# Patient Record
Sex: Female | Born: 1990 | Race: White | Hispanic: No | Marital: Single | Smoking: Never smoker
Health system: Southern US, Community
[De-identification: ages and names within clinical notes are randomized; demographics above are authoritative.]

## PROBLEM LIST (undated history)

## (undated) DIAGNOSIS — F329 Major depressive disorder, single episode, unspecified: Secondary | ICD-10-CM

## (undated) DIAGNOSIS — IMO0002 Reserved for concepts with insufficient information to code with codable children: Secondary | ICD-10-CM

## (undated) DIAGNOSIS — A64 Unspecified sexually transmitted disease: Secondary | ICD-10-CM

## (undated) DIAGNOSIS — K589 Irritable bowel syndrome without diarrhea: Secondary | ICD-10-CM

## (undated) DIAGNOSIS — R87619 Unspecified abnormal cytological findings in specimens from cervix uteri: Secondary | ICD-10-CM

## (undated) DIAGNOSIS — F509 Eating disorder, unspecified: Secondary | ICD-10-CM

## (undated) DIAGNOSIS — F32A Depression, unspecified: Secondary | ICD-10-CM

## (undated) DIAGNOSIS — F419 Anxiety disorder, unspecified: Secondary | ICD-10-CM

## (undated) DIAGNOSIS — Z3493 Encounter for supervision of normal pregnancy, unspecified, third trimester: Secondary | ICD-10-CM

## (undated) DIAGNOSIS — A609 Anogenital herpesviral infection, unspecified: Secondary | ICD-10-CM

## (undated) DIAGNOSIS — R630 Anorexia: Secondary | ICD-10-CM

## (undated) HISTORY — DX: Major depressive disorder, single episode, unspecified: F32.9

## (undated) HISTORY — DX: Anorexia: R63.0

## (undated) HISTORY — DX: Unspecified sexually transmitted disease: A64

## (undated) HISTORY — DX: Unspecified abnormal cytological findings in specimens from cervix uteri: R87.619

## (undated) HISTORY — DX: Depression, unspecified: F32.A

## (undated) HISTORY — DX: Reserved for concepts with insufficient information to code with codable children: IMO0002

## (undated) HISTORY — DX: Eating disorder, unspecified: F50.9

## (undated) HISTORY — DX: Anxiety disorder, unspecified: F41.9

## (undated) HISTORY — PX: AUGMENTATION MAMMAPLASTY: SUR837

## (undated) HISTORY — DX: Irritable bowel syndrome, unspecified: K58.9

## (undated) HISTORY — DX: Anogenital herpesviral infection, unspecified: A60.9

## (undated) HISTORY — PX: COLPOSCOPY: SHX161

---
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
## 2008-03-06 ENCOUNTER — Encounter: Payer: Self-pay | Admitting: Internal Medicine

## 2008-05-25 ENCOUNTER — Telehealth: Payer: Self-pay | Admitting: Internal Medicine

## 2008-07-02 ENCOUNTER — Encounter: Payer: Self-pay | Admitting: Internal Medicine

## 2008-07-08 DIAGNOSIS — K589 Irritable bowel syndrome without diarrhea: Secondary | ICD-10-CM | POA: Insufficient documentation

## 2008-07-08 DIAGNOSIS — K313 Pylorospasm, not elsewhere classified: Secondary | ICD-10-CM | POA: Insufficient documentation

## 2008-07-08 DIAGNOSIS — F339 Major depressive disorder, recurrent, unspecified: Secondary | ICD-10-CM | POA: Insufficient documentation

## 2008-07-08 DIAGNOSIS — F329 Major depressive disorder, single episode, unspecified: Secondary | ICD-10-CM

## 2008-07-14 ENCOUNTER — Ambulatory Visit: Payer: Self-pay | Admitting: Internal Medicine

## 2009-03-05 ENCOUNTER — Encounter: Admission: RE | Admit: 2009-03-05 | Discharge: 2009-03-05 | Payer: Self-pay | Admitting: Obstetrics and Gynecology

## 2009-08-20 ENCOUNTER — Telehealth: Payer: Self-pay | Admitting: Internal Medicine

## 2010-02-03 NOTE — Progress Notes (Signed)
Summary: Medication  Medications Added PAROXETINE HCL 20 MG TABS (PAROXETINE HCL) Take 1 tablet by mouth once a day. MUST HAVE OFFICE VISIT FOR ANY FURTHER REFILLS! PAROXETINE HCL 20 MG TABS (PAROXETINE HCL) Take 1 tablet by mouth once a day. MUST HAVE OFFICE VISIT FOR FURTHER REFILLS! PAROXETINE HCL 20 MG TABS (PAROXETINE HCL) Take 1 tablet by mouth once a day. DOXYCYCLINE HYCLATE 50 MG CAPS (DOXYCYCLINE HYCLATE) one capsule by mouth once daily LEVSIN/SL 0.125 MG SUBL (HYOSCYAMINE SULFATE) Dissolve 1 tablet under tongue every 4 hours as needed for IBS       Phone Note Call from Patient Call back at Home Phone [phone]   Caller: Patient Call For: Dr. Juanda Chance Reason for Call: Talk to Nurse Summary of Call: Needs a refill on her Paxil.[facility] Initial call taken by: Karna Christmas,  August 20, 2009 11:33 AM  Follow-up for Phone Call        Prescription sent. Patient needs office visit for further refills. Follow-up by: Lamona Curl CMA Duncan Dull),  August 20, 2009 11:37 AM    New/Updated Medications: PAROXETINE HCL 20 MG TABS (PAROXETINE HCL) Take 1 tablet by mouth once a day. MUST HAVE OFFICE VISIT FOR FURTHER REFILLS! Prescriptions: PAROXETINE HCL 20 MG TABS (PAROXETINE HCL) Take 1 tablet by mouth once a day. MUST HAVE OFFICE VISIT FOR FURTHER REFILLS!  #30 x 0   Entered by:   Lamona Curl CMA (AAMA)   Authorized by:   Hart Carwin MD   Signed by:   Lamona Curl CMA (AAMA) on 08/20/2009   Method used:   Electronically to        [facility] # [phone]* (retail)       [address]       [place], [place]  [address]       Ph: [phone]       Fax: [phone]   RxID:   [id number]

## 2010-03-16 HISTORY — PX: COLPOSCOPY W/ BIOPSY / CURETTAGE: SUR283

## 2010-05-17 NOTE — Assessment & Plan Note (Signed)
[facility]                         GASTROENTEROLOGY OFFICE NOTE   [name], FILL                           MRN:          [id number]  DATE:01/25/2007                            DOB:          Jul 03, 1990    Anna [name] is a 20 year old female whom we saw in the past for IBS/diarrhea.  She had a complete evaluation which included CT scan of the abdomen and  the pelvis and upper abdominal ultrasound.  She also had small bowel  follow through and upper GI series which showed pylorospasm.  Patient  responded to Paxil initially 10 mg a day, subsequently 20 mg a day.  She  is quite happy with the medicine and has not had any recurrence of  diarrhea, abdominal pain, or any irregularity of her bowels.  She would  like to continue on her Paxil.   PHYSICAL EXAMINATION:  Blood pressure 114/72, pulse 84, and weight 159  pounds.  The patient was not examined.   IMPRESSION:  A 20 year old white female with irritable bowel  syndrome/diarrhea, controlled on Paxil.   PLAN:  Refill through [facility] 20 mg dispense 90 one p.o. daily.  The  patient will return in 1 year.     Hedwig Morton. Juanda Chance, MD  Electronically Signed    DMB/MedQ  DD: 01/25/2007  DT: 01/25/2007  Job #: [id number]   cc:   Gretta Arab. Valentina Lucks, M.D.

## 2010-05-20 NOTE — H&P (Signed)
NAME:  Anna [name], Anna [name]                              ACCOUNT NO.:  [id number]   MEDICAL RECORD NO.:  [id number]                   PATIENT TYPE:  INP   LOCATION:  0105                                 FACILITY:  [facility]   PHYSICIAN:  Beverly Milch, MD                  DATE OF BIRTH:  11-Nov-1990   DATE OF ADMISSION:  05/28/2003  DATE OF DISCHARGE:                         PSYCHIATRIC ADMISSION ASSESSMENT   IDENTIFYING DATA:  This 20 year old female, ninth grade student at [employer], is admitted emergently voluntarily in transfer from [facility], where she was admitted May 24, 2003 at 05:00 from  emergency room assessment five hours earlier for inpatient stabilization of  suicide attempt and depression.  The patient and family are weary from what  they consider five difficult days of medical treatment for the patient's  overdose during which time they impatiently expected to leave treatment  several times.  The patient did complete Mucomyst detoxification treatment  and her liver function tests have just, on the day transfer, started to come  down.  The patient has therefore had the bare minimum amount of medical  treatment, though the patient and family seem to think that she received  overly exhaustive treatment.  The patient states that all of the staff were  nice to her and the patient pressures parents to get her out of the hospital  by being nice to her.  The patient overdosed partly in anger at parents for  grounding her after having an unsupervised party with alcohol and older high  school friends in parent's absence from home.  The patient called her best  friend, who was angry at the patient for having the party and not telling  her, when she was seeking help with what to do about her overdose.  The  patient feels guilty about the suffering of older sister and mother,  relative to the patient's overdose but still, at the same time, the patient  has limited  remorse for her suicide attempt while being depressed since  04/25/02 when grandfather died and another school friend died.  There  is the significant psychological legacy of mother's brother killing himself  by an overdose in his teens, when he was turned away from a psychiatric  hospital and mother is on Prozac.   HISTORY OF PRESENT ILLNESS:  The patient, herself, has seen a therapist once  on an outpatient basis and did not return.  Family and patient in some ways  imply that patient did not return for further treatment because she was  doing too well, though it appears she never had a full assessment.  It  appears more likely that the patient did not return for treatment because  she refused and that the patient will likely refuse current and subsequent  aftercare treatment by being angry with the family.  The patient  have  imposed grounding consequences for the patient after she had apparently an  unsupervised party at the parent's home when they were away with neighbors  acknowledging and, in fact, having to break up the party with the patient's  request for assistance as people would not leave.  The patient had older  high school peers to the party where there was drinking.  The patient feels  she did not drink as much as the others.  The patient did not invite her  best girlfriend to the party or let her know about the party.  The best  girlfriend then became angry at the patient and refused to talk to her and  the patient could tell that her best friend was hurt by the incident just by  the sound of her voice.  The patient tends to hold all of her feelings  inside and just act in anger and demanding this as the youngest child of the  family.  The patient does not disclose to parents her disruptive behaviors  and parents tend to be silenced by the patient's anger over having to take  care of these things.  In fact, the mother states the patient must acquire  accountability for  taking care of her responsibilities and problems.  The  patient's grandfather died of cancer in April 17, 2002 and a friend was  killed in a car wreck from school.  The patient had significant difficulty  getting over these losses and went to counseling on one session.  Mother  takes Prozac but they will not clarify any other counseling or understanding  of the family psychological problems.  It is difficult to tell if mother was  more anxiously or depressively traumatized by the death of her brother as a  teenager but it appears more depressively traumatized.  The patient will  state that she feels guilty for the way she has hurt her mother and older  sister as older sister is due to graduate May 31, 2003 from high school and  the patient needs to be there.  The patient, therefore, has multiple ways,  with friends and family, she has narcissistically structured things that  injure others so that she, herself would not feel injured.  The patient's  best friend called parents who had to seek help for the patient's overdose  by indirect notification in the same that they learned about the party, that  neighbors acknowledged had taken place but the parents were not told  initially by neighbors but by the patient's sister.  The patient does not  acknowledge manic symptoms.  She does not acknowledge use of other drugs.  She does acknowledge using alcohol once or twice weekly, usually on the  weekends, including some binge-drinking at times.  She had overdosed May 23, 2003 at 22:30, apparently a day after being significantly intoxicated with  alcohol.   PAST MEDICAL HISTORY:  The patient has a history of acne, requiring p.r.n.  antibiotic treatment but none currently.  Last menses was May 18, 2003 and  she has been sexually active once without protection.  The patient is  otherwise in good general health.  She has had no seizure or syncope.  She has had no heart murmur or arrhythmia.  She has  had no organic central  nervous system trauma.  However, her protime was significantly elevated as  well as her AST and ALT values with AST reaching 190 and ALT 160 with  albumin and total  protein low.  The patient did retain the Mucomyst, though  finding it a stressful experience as well as having multiple venipunctures  and IVs.  Parents and patient report that they are exhausted.  From the  course of treatment, they expected to be apparently emergency room only but  then became extended over five days.   REVIEW OF SYSTEMS:  The patient denies difficulty with gait, gaze or  countenance.  She denies exposure to communicable disease or toxins.  She  denies rash, jaundice or purpura.  There is no chest pain, palpitations or  presyncope.  There is no abdominal pain, nausea, vomiting or diarrhea.  Both  arms are sore.  The patient has mild acne.  The patient is intelligent with  significant visual and auditory memory skills.   IMMUNIZATIONS:  Up to date.   FAMILY HISTORY:  Mother has depression, treated with Prozac.  Mother's  brother completed suicide by overdose in his teenage years, when he was  apparently turned away from a psychiatric hospital, at which they sought  admission for him.  The patient has an 60 year old sister, who is graduating  from high school in three days and resides with sister and both parents.   SOCIAL AND DEVELOPMENTAL HISTORY:  There are no complications or  consequences of gestation, delivery or neonatal period.  There have been no  developmental delays or learning disorders.  The patient's transition to  high school has been stressful with the patient hanging with older students  which is likely become alienating to her best girlfriend.  The patient has  been sexually active once without protection.  She has been using alcohol  with her peer group of older high school students including binge-drinking  once or twice weekly, usually on the weekend and  apparently being  significantly intoxicated the day before her overdose.  The patient is  resistant to parental direction including likely about her last  psychotherapy and now her hospitalization in the psychiatric unit.  Parents  maintain that the patient will not be able to tolerate group therapy process  work, while Dr. Lindie Spruce informed the parents that this would be the most  important aspect of treatment for becoming capable of family and subsequent  psychotherapeutic change.  Parents need to be able to define the grounding  consequences for the party as well as the expectations for aftercare  psychotherapy including family work before discharge in order for the  patient to establish an internal as well as an interpersonal commitment to  following through safely.   ASSETS:  The patient is intelligent and very capable of manipulation of  parents.   MENTAL STATUS EXAM:  Height is 68 inches and weight is 155 pounds, though she had a stated weight of 145 pounds.  Blood pressure is 131/92 with heart  rate of 96 (sitting) and 133/89 (standing).  Neurological exam is intact.  She is right-handed.  The patient is irritable and tired, indicating that it  was difficult to sleep in the hospital.  She has difficulty initiating sleep  at home as well.  The patient has a headache on arrival and is treated with  ibuprofen 600 mg.  The patient engages in milieu and group treatment  activities much better than she informed parents she would not be able to  do.  She has no anxiety.  She has atypical depressive features with  hypersensitivity to the comments or actions of others.  She has impulse  control difficulty.  She  appears to tend to overeat.  She has outbursts of  anger and her irritable devaluation of others becomes controlling.  She has  narcissistic personality traits but tends to subdue these in order to avoid  consequences from family and thereby she continues to escalate her  destructive  behaviors and risk-taking behaviors without logical and natural  consequences.  Mother seems to be the most vulnerable to the patient's  manipulation.  Father is much more able to set limits but not facilitate  mother's working through.  Family appears to have significant unidentified  conflicts in relational dynamics that patient seems to use to undermine any  change in behavior or emotions herself.  Legacy of uncle's suicide by  overdose, the treatment being sought was inadequate, is a Chief Technology Officer for the  patient and family without their awareness.   IMPRESSION:   AXIS I:  1. Depressive disorder not otherwise specified with atypical features.  2. Rule out alcohol abuse (provisional diagnosis).  3. 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  4. Parent-child problem.  5. Other specified family circumstances.  6. Other interpersonal problem.   AXIS II:  Diagnosis deferred.   AXIS III:  1. Acetaminophen overdose with hepatotoxicity.  2. Acne vulgaris.   AXIS IV:  Stressors:  Family--moderate to severe, acute and chronic; peer  relations--moderate, acute and chronic; phase of life--severe, acute.   AXIS V:  Admission Global Assessment of Functioning 43; highest in last year  estimated 78.   PLAN:  The patient is admitted for inpatient adolescent psychiatric and  multidisciplinary, multimodal behavioral health treatment in a team-based  program at locked psychiatric unit in which I would agree the milieu and  group therapies are most important for this patient's stabilization.  Family  therapy intervention is next most important as cognitive behavioral, milieu  and group therapy mobilize an understanding of the patient's dynamics and  behavioral and cognitive emotional changes necessary.  The patient is  involved in activities for which he is not emotionally prepared.  Suicide  risk intervention and substance abuse prevention are essential.  Anger   management is also important.  ESTIMATED LENGTH OF STAY:  Two to five days.                                               Beverly Milch, MD    GJ/MEDQ  D:  05/28/2003  T:  05/29/2003  Job:  [id number]

## 2010-05-20 NOTE — Op Note (Signed)
Anna [name], SHOAFF NO.:  [id number]   MEDICAL RECORD NO.:  [id number]          PATIENT TYPE:  OIB   LOCATION:  2899                         FACILITY:  [facility]   PHYSICIAN:  Jon Gills, M.D.  DATE OF BIRTH:  29-Apr-1990   DATE OF PROCEDURE:  04/08/2004  DATE OF DISCHARGE:  04/08/2004                                 OPERATIVE REPORT   PREOPERATIVE DIAGNOSIS:  Abdominal pain and diarrhea.   POSTOPERATIVE DIAGNOSIS:  Abdominal pain and diarrhea.   OPERATION PERFORMED:  Upper GI endoscopy with biopsy.   SURGEON:  Jon Gills, M.D.   ANESTHESIA:  General.   DESCRIPTION OF FINDINGS:  Following informed written consent, the patient  was taken to the operating room and placed under general anesthesia with  continuous cardiopulmonary monitoring.  She remained in the supine position  and the Olympus endoscope was advanced by mouth without difficulty.  There  was no visual evidence for esophagitis, gastritis, duodenitis or peptic  ulcer disease.  Significant retained food was found in her stomach and  duodenum, however.  A solitary gastric biopsy was negative for Helicobacter.  Several duodenal biopsies were histologically normal.  The endoscope was  gradually withdrawn and the patient was awakened and taken to recovery room  in satisfactory condition.  She will be released to the care of her parents  later today.   DESCRIPTION OF TECHNICAL PROCEDURES USED:  Olympus GIF-160 endoscope with  cold biopsy forceps.   DESCRIPTION OF SPECIMENS REMOVED:  Gastric times one, duodenum times three.      JHC/MEDQ  D:  04/18/2004  T:  04/18/2004  Job:  [id number]   cc:   Gretta Arab. Valentina Lucks, M.D.  [address] [place]  [place] [address]  Fax: [phone]

## 2010-05-20 NOTE — Assessment & Plan Note (Signed)
[facility]                           GASTROENTEROLOGY OFFICE NOTE   BREA, COLESON                           MRN:          [id number]  DATE:08/22/2005                            DOB:          [birth year]    Anna [name] is a 20 year old rising senior at [employer].  She  was at [employer] for two years and transferred to [employer]  because of flexible program to accommodate her diarrhea.  She has been, for  the past two years, having abdominal pain which is periumbilical, usually  located in anterior upper abdomen, occurring in the morning, resulting in  urgent bowel movements, sometimes diarrhea, but no bleeding.  She has missed  too much school in the mornings that she actually started flexible program  at the [employer] where her classes start at 12:00.  She sometimes  also has problems at night.  She works 4 days a week at [employer] from  5-9 and eats usually at 10:00.  She never has to get up at night to have a  bowel movement.  Her upper abdominal pain is usually relieved by multiple  bowel movements.  Her eating habits are quite erratic, especially since the  diarrhea started.  She basically skips meals to avoid going to the bathroom.  She saw Dr. Chestine Spore and I have the reports of his evaluation, as well as  normal upper abdominal ultrasound, upper GI series without small bowel  follow through, normal upper endoscopy and small bowel biopsies.  I do not  have any results of the blood tests.  She was tried on Bentyl before meals  without any relief, and then saw Dr. Loleta Chance, pediatric gastroenterologist, on  two occasions, since she was temporarily put on PPIs without any  improvement.  [name] has been quite anxious.  She has stopped basically eating  and has lost about 15 pounds in the last year because she is afraid that she  will have diarrhea.  There is positive family history of colon cancer in her  grandfather, positive family history of gallbladder disease in maternal  aunt.  Her mother actually has irritable bowel syndrome, having some  urgencies in the morning.   MEDICATIONS:  Birth control pills to control crampy abdominal pain and  doxycycline for acne.  Past history is significant for depression, anxiety.  Family history positive for prostate cancer in grandfather.   SOCIAL HISTORY:  She lives with her parents.  She is a Consulting civil engineer.  Does not  smoke, does not drink alcohol.   REVIEW OF SYSTEMS:  Negative except for new anxiety.   PHYSICAL EXAMINATION:  VITAL SIGNS:  Blood pressure 108/70.  Pulse 60.  Weight 143 pounds.  She was 156 pounds last year in Dr. Ophelia Charter office.  GENERAL:  She was oriented and in no acute distress.  Cooperative.  HEENT:  Sclerae were anicteric.  Oral cavity was normal.  No aphthous  ulcers.  NECK:  Supple, no adenopathy.  LUNGS:  Clear to auscultation.  No wheezes or rales.  CARDIAC:  Quiet precordial  murmurs. Normal S1, normal S2.  ABDOMEN:  Soft, nondistended, normoactive bowel sounds.  No abnormal rashes.  Tenderness in the epigastrium, periumbilical area and along the right costal  margin.  No rebound, no fullness.  Liver at tip of costal margin.  No CVA  tenderness bilaterally.  RECTAL:  No stool.  Rectal tone was normal.  Mucus was heme-negative.  No  perianal disease.  EXTREMITIES:  No edema.  Deep tendon reflexes were somewhat hyperactive.   IMPRESSION:  1. This is a 20 year old with abdominal pain.  Previously already worked      up by two gastroenterologists and labeled as irritable bowel syndrome.      I agree with the tentative diagnosis of irritable bowel syndrome as      well as pylorospasm, most likely on the basis of IBS.  There is a      remote possibility of endometriosis causing her crampy abdominal pain,      especially with periods, and aggravating her IBS.  This needs to be      further evaluated and she is going to see  Marva Panda for that next      week.  2. Diarrhea.  May be related to sensitive malabsorption, although her      small bowel biopsies were negative, but we are going to obtain tissue      transglutaminase.  As well, I doubt that we are dealing with      inflammatory bowel disease, and this is likely Crohn's disease, but      because of the weight loss, this may have to be assessed as well.  3. Positive family history of gallbladder disease in the setting of normal      ultrasound and possible family history of gallbladder disease, she may      end up with HIDA scan, but we are going to wait at this point.  4. Epigastrium and right upper quadrant discomfort.  Again, this could be      biliary, gastric, or just a colon spasm.   PLAN:  1. I would like to obtain blood test CMET to assess for serum albumin and      liver function tests.  Obtain sed rate, TSH levels and tissue      transglutaminase.  We are going to hold off on small bowel follow      through as well as on a colonoscopy at this time.  2. Start Donnatal Extend Tabs 1 at bedtime.  3. Start Paxil for anxiety 10 mg at bedtime.  4. I will see her again in six weeks.  5. She will also have CT scan of the abdomen and pelvis to look for      endometriosis, pelvic mass, lymphadenopathy, or any structural      abnormalities of the abdomen.                                   Hedwig Morton. Juanda Chance, MD   DMB/MedQ  DD:  08/22/2005  DT:  08/22/2005  Job #:  [id number]   cc:   Gretta Arab. Valentina Lucks, MD  [facility]

## 2010-05-20 NOTE — Discharge Summary (Signed)
NAME:  Anna [name], Anna [name]                              ACCOUNT NO.:  [id number]   MEDICAL RECORD NO.:  [id number]                   PATIENT TYPE:  INP   LOCATION:  0105                                 FACILITY:  [facility]   PHYSICIAN:  Beverly Milch, MD                  DATE OF BIRTH:  10/12/1990   DATE OF ADMISSION:  05/28/2003  DATE OF DISCHARGE:                                 DISCHARGE SUMMARY   IDENTIFYING DATA:  This 20 year old female ninth grade student at [employer] was admitted emergently voluntarily in transfer from [facility] after Mucomyst and intravenous support detoxification of  acetaminophen overdose, which had complex dynamics relative to sister's  graduation, best girlfriend disengaging from the patient's defiant use of  alcohol with older high school student to the exclusion of the best friend,  and mother intervening with grounding for having the party and messing up  sister's graduation.  The patient reacted with angry suicide attempt for  which she feels remorse for her sister and for mother, particularly as  mother experienced a suicide death of her teenage brother in the past when  he was declined psychiatric hospitalization.  Mother is now refusing to  allow the patient psychiatric hospitalization.  Mother and the patient both  resist psychotherapy as evidenced by disengaging from a single session of  psychotherapy in the past for the patient regarding depression over  grandfather's death in 05/06/04and the death of a school peer in a car  wreck.  Mother is on Prozac but the patient is resisting Prozac.  For full  details, please see the typed admission assessment.   HISTORY OF PRESENT ILLNESS:  The patient maintains that her depression is  more related to family and school affairs and builds up as these seem to  break down.  The patient acknowledges that father works a lot and plays golf  though he does spend a lot of time with mother.  The  patient can gradually  state that mother is too emotionally sensitive and that the patient becomes  alienated in her behavior and in any structured problem-solving by mother's  reactivity.  The patient has used alcohol in a binge fashion recently.  She  has been relatively defiant.  The patient is not as productive in school.   INITIAL MENTAL STATUS EXAM:  The patient has narcissistic, irritable  defiance that undermines mother's efforts to structure containment.  Mother  then becomes even more upset and undermines the patient's treatment  opportunity.  This was evident in the lengthy admission process in which  parents demanded for the patient to leave multiple times and did not want  her to work on solutions to these problems in any way that the patient did  not appreciate while at the same time indicating that the patient's attitude  and approach had to  change.  The patient had no psychotic symptoms.  She has  atypical depressive symptoms and her oppositionality is more adherent to her  identity disorder with narcissistic features though there is some concern  for consequences to her behavior and interpersonal function from episodic  alcohol use.   HOSPITAL COURSE AND TREATMENT:  No additional laboratory testing was  undertaken as the patient complained that both arms were very sore from IVs  and recurrent blood draws as she was being monitored for the course of her  Mucomyst detoxification of her acetaminophen overdose.  Mother minimizes the  significance of the patient's suicide attempt and just considers the patient  to have been angry.  The patient did explore in the course of hospital  treatment all aspects of her overdose in her bedroom in anger after which  she called her best girlfriend who got help for her, effectively in the end  restructuring nurturing relations from girlfriend and parents without  focusing as much on the patient's social and behavioral failure and having   the alcohol-related party for older high school students, which required the  neighbor's assistance to get the high school students out of the home before  parents got home.  The patient's general medical exam noted numerous  fractures from rollerblading and trampolining in the past.  The patient had  menarche at age 11 and acknowledged sexual activity once in the past but  that parents are not aware of this.  Parents are also not aware of the  extent of the patient's alcohol use in a binge type fashion that has been  short term but complicating the course of her high school development.  The  patient is emotionally not able to integrate the things she is doing in  competing with older sister and mother.  The patient could clarify in the  course of her group and milieu and individual work in therapy that she  desired a female therapist who is the opposite of her mother.  The patient did  make a commitment that she would attend therapy at least individually and  mother is making some commitment to attend family therapy with the patient.  The patient refused Prozac or other antidepressant pharmacotherapy though  such was explored thoroughly with the patient, particularly as possibly the  only option if she does not participate effectively in psychotherapy.  The  patient worked through her suicidal ideation though she never fully  clarified even for herself the dynamics of the actual moment of ingesting  the pills though she fully understands the dynamics leading up to the  ingestion and the events afterward.  She did improve in the course of  treatment though minimizing the need and significance doing so.  Her vital  signs remained normal and she resumed effective nutrition with restoration  of appetite and she had no suicidal ideation and had the capacity to abstain  from acting on any suicide impulses should such occur again.  Her blood pressure was 109/72 with heart rate 63 sitting at the  time of discharge and  standing blood pressure 111/69 with heart rate of 81.  She had no hepatic  dysfunction symptoms.  The patient did a better job in the final family  therapy session and could agree to comply with parents' conclusion that she  would be grounded for two weeks from the computer and grounded to home for  two weeks but would be allowed to use her cell phone after one week and  to  go to the swimming pool.  Parents concluded that one week in the hospital  served a significant portion of the necessary grounding as well.  The  patient expressed negative body language after that but could verbally  process her resolution.  Parents could maintain firm conviction to the  behavioral resolution and consequences and the patient did not have any  suicidal ideation emerge in the course of that final family therapy session.   FINAL DIAGNOSES:   AXIS I:  1. 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  2. Identity disorder with narcissistic features.  3. Rule out evolving alcohol abuse (provisional diagnosis).  4. Parent-child problem.  5. Other specified family circumstances.  6. Other interpersonal problems.   AXIS II:  Diagnosis deferred.   AXIS III:  1. Acetaminophen overdose with hepatotoxicity.  2. Acne vulgaris.   AXIS IV:  Stressors: Family- moderate to severe, acute and chronic; peer  relations- moderate, acute and chronic; phase of life- severe, acute.   AXIS V:  Global assessment of functioning at the time of admission was 43  with highest in the last year estimated at 78 and discharge global  assessment of functioning 49.   PLAN:  The patient was discharged in improved condition though needing three  to four more days of inpatient treatment that parents disavow and disallow.  Parents conclude that they are convinced the patient is absolutely not  suicidal and that they can provide containment and support for resolution of  current level  of intense family and intrapsychic conflict for resolution.  They do agree to ongoing psychotherapy, recognizing the benefit of  psychotherapeutic interventions thus far though they have been unable to  tolerate these in the past.  The patient is on no medications at the time of  discharge.  Still, she understands that Prozac or an alternative  antidepressant will be necessary, particularly if she fails in psychotherapy  or particularly if she again undermines psychotherapy by not attending.  She  follows a regular diet and has no restrictions on physical activity.  Crisis  and safety plans are outlined if needed.  They will see Glendell Docker for  individual and family therapy with parents to call for the appointment and  will see Jasmine Pang, M.D., for psychiatric followup.  Parents also  require to call but knowing that appointments are available for the week of June 9.  There is a signed release on the chart for both courtesy copies.                                               Beverly Milch, MD    GJ/MEDQ  D:  05/30/2003  T:  05/30/2003  Job:  [id number]   cc:   Glendell Docker, MS, CCAS, MAC, NCC,  [address] [place]  [address]  [place], [place] [address]   Jasmine Pang, M.D.  Fax: [phone]

## 2010-05-20 NOTE — Assessment & Plan Note (Signed)
[facility]                           GASTROENTEROLOGY OFFICE NOTE   ARANDA, BIHM                           MRN:          [id number]  DATE:11/27/2005                            DOB:          [birth year]    Anna [name] is a very nice 20 year old high school student whom we saw in August of  this year for abdominal pain and diarrhea.  Her GI evaluation was  essentially negative.  We have put her on Paxil 10 mg a day and she has had  about 50% improvement of her symptoms.  Her mother, today, reports that this  is the best that Anna [name] has been in a long time.  She essentially denies  diarrhea.  She has occasional abdominal pain about twice a week, but has not  missed any school.  She takes her medication at night.   PHYSICAL EXAM:  Blood pressure 104/68, pulse 60, and weight 147 pounds.  She was alert and oriented, no distress.  Her abdominal exam was remarkable for tenderness in the epigastric area.  Lower abdomen was normal.  Bowel sounds were normoactive.  There was no  distension.  Left upper quadrant was normal.   IMPRESSION:  A 20 year old white female with irritable bowel syndrome,  functional epigastric pain, improved on Paxil 10 mg a day.   PLAN:  In order to reduce the frequency of the abdominal discomfort, we will  try to increase the Paxil to 20 mg per day, which is the usual dose anyway.  She will take it on a daily basis for the next 4 weeks and see whether the  increased dose improves the GI complaints.  If not, she will go back down to  10 mg daily.  I will see her in about 6 months.     Hedwig Morton. Juanda Chance, MD  Electronically Signed    DMB/MedQ  DD: 11/27/2005  DT: 11/27/2005  Job #: [id number]   cc:   Gretta Arab. Valentina Lucks, M.D.

## 2010-05-20 NOTE — Discharge Summary (Signed)
NAME:  Anna [name], Anna [name]                              ACCOUNT NO.:  [id number]   MEDICAL RECORD NO.:  [id number]                   PATIENT TYPE:  INP   LOCATION:  6126                                 FACILITY:  [facility]   PHYSICIAN:  Asher Muir, M.D.                      DATE OF BIRTH:  [birth year]   DATE OF ADMISSION:  05/23/2003  DATE OF DISCHARGE:  05/28/2003                                 DISCHARGE SUMMARY   DISCHARGE DIAGNOSES:  1. Elevated transaminase levels.  2. Intentional ingestion of Tylenol.  3. Status post 17 doses of Mucomyst.   CONSULTS:  None.   PROCEDURES:  None.   LABORATORY DATA:  Initial acetaminophen level was 59.7.  Acetaminophen level  four hours later was 78.7.  Acetaminophen level 24 hours after initial draw  was less than 10.  Alcohol level was 5 on admission.  Urine pregnancy test  was negative.  Urine tox screen was negative.  Initial liver function  enzymes revealed an AST of 168 with an ALT OF 100.  Follow-up enzymes for  the next 72 hours continued to increase with a maximum AST of 190 and an AL  OF 160.  On the day of discharge, the patient's AST was 183 and the  patient's ALT was 160.  Thus, her liver function tests have now peaked.   Remainder of the hepatic panel including bilirubin and alkaline phosphatase  is negative.  Initial INR was 1.4.  INR on discharge was 1.2.  CBC was  within normal limits and BNP was within normal limits.  Hepatitis serologies  are pending at time of discharge.   HISTORY OF PRESENT ILLNESS:  Anna [name] is a 20 year old Caucasian female with no  significant past medical history who admits to intentional ingestion of 10  to 12 Tylenol Extended Release 650 mg tablets at 10:30 p.m. on May 21.  The  patient immediately contacted a friend who then contacted her father who  then contacted the patient's parents to notify them of the ingestion who  then brought her to the emergency room.  The patient denies that she was  intentionally trying  to hurt herself; however, she had just had an argument  with her parents regarding disciplinary actions.   The patient's physical exam and vital signs were normal.  Her only physical  symptom at the time of admission was some right upper quadrant pain and  nausea.  Please see the admission H&P for full admission details.   HOSPITAL COURSE:  Intentional Tylenol ingestion.  The laboratory data is  listed above.  The patient was given Mucomyst every 4 hours for a total of  19 doses during the close monitoring of her liver function tests.  The [facility] toxicologist was consulted regarding further  continuation of the Mucomyst given that enzymes had not peaked after 72  hours.  The toxicologist recommended to discontinue the Mucomyst.  In the 24  hours following discontinuation of the Mucomyst, the LFTs have peaked and  are now decreasing.  Thus, there is no further need for intervention at this  time.  The patient's nausea resolved within a couple days following  admission, and she was at her normal baseline activity level and was  tolerating p.o. without complication.  At no time did the patient have  jaundice or other physical symptoms of liver failure.  The patient denied  active suicidal ideation or a plan throughout the remainder of hospital  course; however, a sitter was present during the patient's entire hospital  course.   DISCHARGE DESTINATION:  The patient is to be discharged to [facility] for further psychological evaluation.   SUGGESTIVE FOLLOW-UP ITEMS:  1. The patient should have appropriate STD screening which would probably     include urine studies for GC and Chlamydia as well as HIV and syphilis     testing.  2. The patient's hepatitis serologies are pending at time of discharge.     These items should be followed up throughout the remainder of her     inpatient course.   FOLLOW-UP APPOINTMENTS:  1. Follow-up psychology  appointments will be arranged by [facility].  2. The patient will follow up with her primary care Anna [name] as needed.      Franchot Mimes, MD                         Asher Muir, M.D.    TV/MEDQ  D:  05/28/2003  T:  05/30/2003  Job:  [id number]   cc:   Anna [name], M.D.  [address] [place]  [place] [address]  Fax: [phone]   [address] [place]. [phone]

## 2010-05-25 ENCOUNTER — Other Ambulatory Visit: Payer: Self-pay | Admitting: Dermatology

## 2010-08-03 ENCOUNTER — Emergency Department (HOSPITAL_COMMUNITY)
Admission: EM | Admit: 2010-08-03 | Discharge: 2010-08-03 | Disposition: A | Discharge status: Home / Self Care | Payer: BC Managed Care – PPO | Attending: Emergency Medicine | Admitting: Emergency Medicine

## 2010-08-03 ENCOUNTER — Emergency Department (HOSPITAL_COMMUNITY): Payer: BC Managed Care – PPO

## 2010-08-03 DIAGNOSIS — R232 Flushing: Secondary | ICD-10-CM | POA: Insufficient documentation

## 2010-08-03 DIAGNOSIS — R5381 Other malaise: Secondary | ICD-10-CM | POA: Insufficient documentation

## 2010-08-03 DIAGNOSIS — R55 Syncope and collapse: Secondary | ICD-10-CM | POA: Insufficient documentation

## 2010-08-03 DIAGNOSIS — R42 Dizziness and giddiness: Secondary | ICD-10-CM | POA: Insufficient documentation

## 2010-08-03 DIAGNOSIS — R61 Generalized hyperhidrosis: Secondary | ICD-10-CM | POA: Insufficient documentation

## 2010-08-03 DIAGNOSIS — R5383 Other fatigue: Secondary | ICD-10-CM | POA: Insufficient documentation

## 2010-08-03 LAB — BASIC METABOLIC PANEL
BUN: 15 mg/dL (ref 6–23)
CO2: 24 mEq/L (ref 19–32)
Chloride: 106 mEq/L (ref 96–112)
Creatinine, Ser: 1.06 mg/dL (ref 0.50–1.10)
Potassium: 4.4 mEq/L (ref 3.5–5.1)

## 2010-08-03 LAB — CBC
HCT: 38.1 % (ref 36.0–46.0)
MCV: 95.3 fL (ref 78.0–100.0)
Platelets: 204 10*3/uL (ref 150–400)
RBC: 4 MIL/uL (ref 3.87–5.11)
WBC: 5.2 10*3/uL (ref 4.0–10.5)

## 2010-08-03 LAB — DIFFERENTIAL
Basophils Absolute: 0 10*3/uL (ref 0.0–0.1)
Lymphocytes Relative: 21 % (ref 12–46)
Lymphs Abs: 1.1 10*3/uL (ref 0.7–4.0)
Neutrophils Relative %: 71 % (ref 43–77)

## 2010-08-03 LAB — TROPONIN I: Troponin I: <0.3 ng/mL (ref <0.30)

## 2011-03-03 DIAGNOSIS — R630 Anorexia: Secondary | ICD-10-CM

## 2011-03-03 HISTORY — DX: Anorexia: R63.0

## 2011-03-27 ENCOUNTER — Other Ambulatory Visit: Payer: Self-pay | Admitting: Nurse Practitioner

## 2011-03-27 ENCOUNTER — Other Ambulatory Visit: Payer: Self-pay | Admitting: Obstetrics and Gynecology

## 2011-03-27 DIAGNOSIS — R630 Anorexia: Secondary | ICD-10-CM

## 2012-03-26 ENCOUNTER — Encounter: Payer: Self-pay | Admitting: Nurse Practitioner

## 2012-03-26 DIAGNOSIS — F5000 Anorexia nervosa, unspecified: Secondary | ICD-10-CM | POA: Insufficient documentation

## 2012-03-28 ENCOUNTER — Ambulatory Visit: Payer: Self-pay | Admitting: Nurse Practitioner

## 2012-05-16 ENCOUNTER — Ambulatory Visit: Payer: Self-pay | Admitting: Nurse Practitioner

## 2012-06-12 ENCOUNTER — Ambulatory Visit (INDEPENDENT_AMBULATORY_CARE_PROVIDER_SITE_OTHER): Payer: BC Managed Care – PPO | Admitting: Nurse Practitioner

## 2012-06-12 ENCOUNTER — Encounter: Payer: Self-pay | Admitting: Nurse Practitioner

## 2012-06-12 VITALS — BP 104/60 | HR 74 | Resp 12 | Ht 68.0 in | Wt 149.8 lb

## 2012-06-12 DIAGNOSIS — Z113 Encounter for screening for infections with a predominantly sexual mode of transmission: Secondary | ICD-10-CM

## 2012-06-12 DIAGNOSIS — Z0000 Encounter for general adult medical examination without abnormal findings: Secondary | ICD-10-CM

## 2012-06-12 DIAGNOSIS — Z01419 Encounter for gynecological examination (general) (routine) without abnormal findings: Secondary | ICD-10-CM

## 2012-06-12 LAB — POCT URINALYSIS DIPSTICK
Spec Grav, UA: 1.015
Urobilinogen, UA: NEGATIVE
pH, UA: 6

## 2012-06-12 LAB — STD PANEL
HIV: NONREACTIVE
Hepatitis B Surface Ag: NEGATIVE

## 2012-06-12 MED ORDER — LEVONORGESTREL-ETHINYL ESTRAD 0.15-30 MG-MCG PO TABS: 1.0000 | ORAL_TABLET | Freq: Every day | ORAL | Status: DC

## 2012-06-12 NOTE — Progress Notes (Signed)
22 y.o. G0P0000 Single Caucasian Fe here for annual exam.  Menses last for 5 days, moderate to light. Slight cramps. Same partner for 7 months. Wants STD's testing.  She still struggles with weight issues and does not feel that Paxil is helping her any.  She continues in therapy.  Patient's last menstrual period was 05/31/2012.          Sexually active: yes  The current method of family planning is OCP (estrogen/progesterone).    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    reports that she has never smoked. She has never used smokeless tobacco. She reports that she does not drink alcohol or use illicit drugs.  Past Medical History  Diagnosis Date  . Anxiety   . Depression   . IBS (irritable bowel syndrome)   . 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 Anorexia 03/2011    Treated at [facility] X 2 mos  . Eating disorder     binges and purges daily     Past Surgical History  Procedure Laterality Date  . Colposcopy w/ biopsy / curettage  03/16/10    mild dysplasia HPV effect, CIN I    Current Outpatient Prescriptions  Medication Sig Dispense Refill  . levonorgestrel-ethinyl estradiol (PORTIA-28) 0.15-30 MG-MCG tablet Take 1 tablet by mouth daily.      Marland Kitchen PARoxetine (PAXIL) 40 MG tablet Take 40 mg by mouth every morning.       No current facility-administered medications for this visit.    Family History  Problem Relation Age of Onset  . Heart disease Father   . Prostate cancer Maternal Grandfather   . Lung cancer Paternal Grandfather     ROS:  Pertinent items are noted in HPI.  Otherwise, a comprehensive ROS was negative.  Exam:   BP 104/60  Pulse 74  Resp 12  Ht 5\' 8"  (1.727 m)  Wt 149 lb 12.8 oz (67.949 kg)  BMI 22.78 kg/m2  LMP 05/31/2012 Height: 5\' 8"   (172.7 cm)                Weight 03/27/10 = 114 lbs  Ht Readings from Last 3 Encounters:  06/12/12 5\' 8"  (1.727 m)  07/14/08 5\' 8"  (1.727 m)    General appearance: alert, cooperative and appears stated age Head: Normocephalic, without obvious abnormality, atraumatic Neck: no adenopathy, supple, symmetrical, trachea midline and thyroid normal to inspection and palpation Lungs: clear to auscultation bilaterally Breasts: normal appearance, no masses or tenderness Heart: regular rate and rhythm Abdomen: soft, non-tender; no masses,  no organomegaly Extremities: extremities normal, atraumatic, no cyanosis or edema Skin: Skin color, texture, turgor normal. No rashes or lesions Lymph nodes: Cervical, supraclavicular, and axillary nodes normal. No abnormal inguinal nodes palpated Neurologic: Grossly normal   Pelvic: External genitalia:  no lesions              Urethra:  normal appearing urethra with no masses, tenderness or lesions              Bartholin's and Skene's: normal                 Vagina: normal appearing vagina with normal color and discharge, no lesions  Cervix: anteverted              Pap taken: yes Bimanual Exam:  Uterus:  normal size, contour, position, consistency, mobility, non-tender              Adnexa: no mass, fullness, tenderness               Rectovaginal: Confirms               Anus:  normal sphincter tone, no lesions  A:  Well Woman with normal exam  Contraception  History of anorexia and bulimia  R/O STD  P:   Pap smear as per guidelines   Refill on Portia OCP for 1 year  Call with results STD's  counseled on STD prevention, adequate intake of calcium and vitamin D,   diet and exercise  Encouraged to continue with counseling return annually or prn  An After Visit Summary was printed and given to the patient.

## 2012-06-12 NOTE — Progress Notes (Signed)
Reviewed personally.  M. Suzanne Evora Schechter, MD.  

## 2012-06-12 NOTE — Patient Instructions (Signed)
General topics  Next pap or exam is  due in 1 year Take a Women's multivitamin Take 1200 mg. of calcium daily - prefer dietary If any concerns in interim to call back  Breast Self-Awareness Practicing breast self-awareness may pick up problems early, prevent significant medical complications, and possibly save your life. By practicing breast self-awareness, you can become familiar with how your breasts look and feel and if your breasts are changing. This allows you to notice changes early. It can also offer you some reassurance that your breast health is good. One way to learn what is normal for your breasts and whether your breasts are changing is to do a breast self-exam. If you find a lump or something that was not present in the past, it is best to contact your caregiver right away. Other findings that should be evaluated by your caregiver include nipple discharge, especially if it is bloody; skin changes or reddening; areas where the skin seems to be pulled in (retracted); or new lumps and bumps. Breast pain is seldom associated with cancer (malignancy), but should also be evaluated by a caregiver. BREAST SELF-EXAM The best time to examine your breasts is 5 7 days after your menstrual period is over.  ExitCare Patient Information 2013 [employer].   Exercise to Stay Healthy Exercise helps you become and stay healthy. EXERCISE IDEAS AND TIPS Choose exercises that:  You enjoy.  Fit into your day. You do not need to exercise really hard to be healthy. You can do exercises at a slow or medium level and stay healthy. You can:  Stretch before and after working out.  Try yoga, Pilates, or tai chi.  Lift weights.  Walk fast, swim, jog, run, climb stairs, bicycle, dance, or rollerskate.  Take aerobic classes. Exercises that burn about 150 calories:  Running 1  miles in 15 minutes.  Playing volleyball for 45 to 60 minutes.  Washing and waxing a car for 45 to 60  minutes.  Playing touch football for 45 minutes.  Walking 1  miles in 35 minutes.  Pushing a stroller 1  miles in 30 minutes.  Playing basketball for 30 minutes.  Raking leaves for 30 minutes.  Bicycling 5 miles in 30 minutes.  Walking 2 miles in 30 minutes.  Dancing for 30 minutes.  Shoveling snow for 15 minutes.  Swimming laps for 20 minutes.  Walking up stairs for 15 minutes.  Bicycling 4 miles in 15 minutes.  Gardening for 30 to 45 minutes.  Jumping rope for 15 minutes.  Washing windows or floors for 45 to 60 minutes. Document Released: 01/21/2010 Document Revised: 03/13/2011 Document Reviewed: 01/21/2010 ExitCare Patient Information 2013 [employer].   Other topics ( that may be useful information):    Sexually Transmitted Disease Sexually transmitted disease (STD) refers to any infection that is passed from person to person during sexual activity. This may happen by way of saliva, semen, blood, vaginal mucus, or urine. Common STDs include:  Gonorrhea.  Chlamydia.  Syphilis.  HIV/AIDS.  Genital herpes.  Hepatitis B and C.  Trichomonas.  Human papillomavirus (HPV).  Pubic lice. CAUSES  An STD may be spread by bacteria, virus, or parasite. A person can get an STD by:  Sexual intercourse with an infected person.  Sharing sex toys with an infected person.  Sharing needles with an infected person.  Having intimate contact with the genitals, mouth, or rectal areas of an infected person. SYMPTOMS  Some people may not have any symptoms, but   they can still pass the infection to others. Different STDs have different symptoms. Symptoms include:  Painful or bloody urination.  Pain in the pelvis, abdomen, vagina, anus, throat, or eyes.  Skin rash, itching, irritation, growths, or sores (lesions). These usually occur in the genital or anal area.  Abnormal vaginal discharge.  Penile discharge in men.  Soft, flesh-colored skin growths in the  genital or anal area.  Fever.  Pain or bleeding during sexual intercourse.  Swollen glands in the groin area.  Yellow skin and eyes (jaundice). This is seen with hepatitis. DIAGNOSIS  To make a diagnosis, your caregiver may:  Take a medical history.  Perform a physical exam.  Take a specimen (culture) to be examined.  Examine a sample of discharge under a microscope.  Perform blood test TREATMENT   Chlamydia, gonorrhea, trichomonas, and syphilis can be cured with antibiotic medicine.  Genital herpes, hepatitis, and HIV can be treated, but not cured, with prescribed medicines. The medicines will lessen the symptoms.  Genital warts from HPV can be treated with medicine or by freezing, burning (electrocautery), or surgery. Warts may come back.  HPV is a virus and cannot be cured with medicine or surgery.However, abnormal areas may be followed very closely by your caregiver and may be removed from the cervix, vagina, or vulva through office procedures or surgery. If your diagnosis is confirmed, your recent sexual partners need treatment. This is true even if they are symptom-free or have a negative culture or evaluation. They should not have sex until their caregiver says it is okay. HOME CARE INSTRUCTIONS  All sexual partners should be informed, tested, and treated for all STDs.  Take your antibiotics as directed. Finish them even if you start to feel better.  Only take over-the-counter or prescription medicines for pain, discomfort, or fever as directed by your caregiver.  Rest.  Eat a balanced diet and drink enough fluids to keep your urine clear or pale yellow.  Do not have sex until treatment is completed and you have followed up with your caregiver. STDs should be checked after treatment.  Keep all follow-up appointments, Pap tests, and blood tests as directed by your caregiver.  Only use latex condoms and water-soluble lubricants during sexual activity. Do not use  petroleum jelly or oils.  Avoid alcohol and illegal drugs.  Get vaccinated for HPV and hepatitis. If you have not received these vaccines in the past, talk to your caregiver about whether one or both might be right for you.  Avoid risky sex practices that can break the skin. The only way to avoid getting an STD is to avoid all sexual activity.Latex condoms and dental dams (for oral sex) will help lessen the risk of getting an STD, but will not completely eliminate the risk. SEEK MEDICAL CARE IF:   You have a fever.  You have any new or worsening symptoms. Document Released: 03/11/2002 Document Revised: 03/13/2011 Document Reviewed: 03/18/2010 ExitCare Patient Information 2013 [employer].    Domestic Abuse You are being battered or abused if someone close to you hits, pushes, or physically hurts you in any way. You also are being abused if you are forced into activities. You are being sexually abused if you are forced to have sexual contact of any kind. You are being emotionally abused if you are made to feel worthless or if you are constantly threatened. It is important to remember that help is available. No one has the right to abuse you. PREVENTION OF FURTHER   ABUSE  Learn the warning signs of danger. This varies with situations but may include: the use of alcohol, threats, isolation from friends and family, or forced sexual contact. Leave if you feel that violence is going to occur.  If you are attacked or beaten, report it to the police so the abuse is documented. You do not have to press charges. The police can protect you while you or the attackers are leaving. Get the officer's name and badge number and a copy of the report.  Find someone you can trust and tell them what is happening to you: your caregiver, a nurse, clergy member, close friend or family member. Feeling ashamed is natural, but remember that you have done nothing wrong. No one deserves abuse. Document Released:  12/17/1999 Document Revised: 03/13/2011 Document Reviewed: 02/24/2010 ExitCare Patient Information 2013 [employer].    How Much is Too Much Alcohol? Drinking too much alcohol can cause injury, accidents, and health problems. These types of problems can include:   Car crashes.  Falls.  Family fighting (domestic violence).  Drowning.  Fights.  Injuries.  Burns.  Damage to certain organs.  Having a baby with birth defects. ONE DRINK CAN BE TOO MUCH WHEN YOU ARE:  Working.  Pregnant or breastfeeding.  Taking medicines. Ask your doctor.  Driving or planning to drive. If you or someone you know has a drinking problem, get help from a doctor.  Document Released: 10/15/2008 Document Revised: 03/13/2011 Document Reviewed: 10/15/2008 ExitCare Patient Information 2013 [employer].   Smoking Hazards Smoking cigarettes is extremely bad for your health. Tobacco smoke has over 200 known poisons in it. There are over 60 chemicals in tobacco smoke that cause cancer. Some of the chemicals found in cigarette smoke include:   Cyanide.  Benzene.  Formaldehyde.  Methanol (wood alcohol).  Acetylene (fuel used in welding torches).  Ammonia. Cigarette smoke also contains the poisonous gases nitrogen oxide and carbon monoxide.  Cigarette smokers have an increased risk of many serious medical problems and Smoking causes approximately:  90% of all lung cancer deaths in men.  80% of all lung cancer deaths in women.  90% of deaths from chronic obstructive lung disease. Compared with nonsmokers, smoking increases the risk of:  Coronary heart disease by 2 to 4 times.  Stroke by 2 to 4 times.  Men developing lung cancer by 23 times.  Women developing lung cancer by 13 times.  Dying from chronic obstructive lung diseases by 12 times.  . Smoking is the most preventable cause of death and disease in our society.  WHY IS SMOKING ADDICTIVE?  Nicotine is the chemical  agent in tobacco that is capable of causing addiction or dependence.  When you smoke and inhale, nicotine is absorbed rapidly into the bloodstream through your lungs. Nicotine absorbed through the lungs is capable of creating a powerful addiction. Both inhaled and non-inhaled nicotine may be addictive.  Addiction studies of cigarettes and spit tobacco show that addiction to nicotine occurs mainly during the teen years, when young people begin using tobacco products. WHAT ARE THE BENEFITS OF QUITTING?  There are many health benefits to quitting smoking.   Likelihood of developing cancer and heart disease decreases. Health improvements are seen almost immediately.  Blood pressure, pulse rate, and breathing patterns start returning to normal soon after quitting. QUITTING SMOKING   American Lung Association - 1-800-LUNGUSA  American Cancer Society - 1-800-ACS-2345 Document Released: 01/27/2004 Document Revised: 03/13/2011 Document Reviewed: 09/30/2008 ExitCare Patient Information 2013 [employer].   Stress Management Stress is a state of physical or mental tension that often results from changes in your life or normal routine. Some common causes of stress are:  Death of a loved one.  Injuries or severe illnesses.  Getting fired or changing jobs.  Moving into a new home. Other causes may be:  Sexual problems.  Business or financial losses.  Taking on a large debt.  Regular conflict with someone at home or at work.  Constant tiredness from lack of sleep. It is not just bad things that are stressful. It may be stressful to:  Win the lottery.  Get married.  Buy a new car. The amount of stress that can be easily tolerated varies from person to person. Changes generally cause stress, regardless of the types of change. Too much stress can affect your health. It may lead to physical or emotional problems. Too little stress (boredom) may also become stressful. SUGGESTIONS TO  REDUCE STRESS:  Talk things over with your family and friends. It often is helpful to share your concerns and worries. If you feel your problem is serious, you may want to get help from a professional counselor.  Consider your problems one at a time instead of lumping them all together. Trying to take care of everything at once may seem impossible. List all the things you need to do and then start with the most important one. Set a goal to accomplish 2 or 3 things each day. If you expect to do too many in a single day you will naturally fail, causing you to feel even more stressed.  Do not use alcohol or drugs to relieve stress. Although you may feel better for a short time, they do not remove the problems that caused the stress. They can also be habit forming.  Exercise regularly - at least 3 times per week. Physical exercise can help to relieve that "uptight" feeling and will relax you.  The shortest distance between despair and hope is often a good night's sleep.  Go to bed and get up on time allowing yourself time for appointments without being rushed.  Take a short "time-out" period from any stressful situation that occurs during the day. Close your eyes and take some deep breaths. Starting with the muscles in your face, tense them, hold it for a few seconds, then relax. Repeat this with the muscles in your neck, shoulders, hand, stomach, back and legs.  Take good care of yourself. Eat a balanced diet and get plenty of rest.  Schedule time for having fun. Take a break from your daily routine to relax. HOME CARE INSTRUCTIONS   Call if you feel overwhelmed by your problems and feel you can no longer manage them on your own.  Return immediately if you feel like hurting yourself or someone else. Document Released: 06/14/2000 Document Revised: 03/13/2011 Document Reviewed: 02/04/2007 ExitCare Patient Information 2013 [employer].   

## 2012-06-14 LAB — IPS N GONORRHOEA AND CHLAMYDIA BY PCR

## 2012-06-17 ENCOUNTER — Telehealth: Payer: Self-pay | Admitting: *Deleted

## 2012-06-17 NOTE — Telephone Encounter (Signed)
Message copied by Osie Bond on Mon Jun 17, 2012 10:30 AM ------      Message from: Ria Comment R      Created: Mon Jun 17, 2012  8:34 AM       Let patient know negative ------

## 2012-06-17 NOTE — Telephone Encounter (Signed)
LVM for pt to return my call in regards to lab results.  

## 2012-06-18 ENCOUNTER — Telehealth: Payer: Self-pay | Admitting: *Deleted

## 2012-06-18 NOTE — Telephone Encounter (Signed)
Pt is aware of all negative lab results.  

## 2012-06-18 NOTE — Telephone Encounter (Signed)
Message copied by Osie Bond on Tue Jun 18, 2012  9:23 AM ------      Message from: Ria Comment R      Created: Thu Jun 13, 2012 10:12 AM       Hold for rest of labs ------

## 2012-06-18 NOTE — Telephone Encounter (Signed)
LVM for pt to return my call in regards to lab results.  

## 2012-11-02 DIAGNOSIS — A64 Unspecified sexually transmitted disease: Secondary | ICD-10-CM

## 2012-11-02 HISTORY — DX: Unspecified sexually transmitted disease: A64

## 2012-11-25 ENCOUNTER — Telehealth: Payer: Self-pay | Admitting: Nurse Practitioner

## 2012-11-25 ENCOUNTER — Ambulatory Visit (INDEPENDENT_AMBULATORY_CARE_PROVIDER_SITE_OTHER): Payer: BC Managed Care – PPO | Admitting: Nurse Practitioner

## 2012-11-25 ENCOUNTER — Encounter: Payer: Self-pay | Admitting: Nurse Practitioner

## 2012-11-25 VITALS — BP 116/74 | HR 68 | Ht 68.0 in | Wt 150.0 lb

## 2012-11-25 DIAGNOSIS — N9089 Other specified noninflammatory disorders of vulva and perineum: Secondary | ICD-10-CM

## 2012-11-25 MED ORDER — LORAZEPAM 0.5 MG PO TABS: 0.5000 mg | ORAL_TABLET | Freq: Three times a day (TID) | ORAL | Status: DC

## 2012-11-25 MED ORDER — VALACYCLOVIR HCL 1 G PO TABS: 1000.0000 mg | ORAL_TABLET | Freq: Two times a day (BID) | ORAL | Status: DC

## 2012-11-25 MED ORDER — LIDOCAINE HCL 2 % EX GEL: 1.0000 "application " | Freq: Three times a day (TID) | CUTANEOUS | Status: DC

## 2012-11-25 NOTE — Progress Notes (Signed)
Subjective:     Patient ID: Anna [name], female   DOB: 17-Sep-1990, 22 y.o.   MRN: [id number]  HPI  This 22 yo WS Fe presents with history of multiple vaginal sores since Friday.  By Saturday after SA symptoms became worse and much more painful. She has pain on voiding secondary to the vulvar pain.  Denies urinary symptoms.  She has felt fatigue, low grade fever, and achy feeling in the groin areas.    She has no prior history of HSV.   Prior to being SA on Saturday, they are usually SA a few times per week.  He has no history of HSV and other STD's were done since they started dating last November.  She is very emotional during the visit.   Review of Systems  Constitutional: Negative for fever and fatigue.  Respiratory: Negative.   Cardiovascular: Negative.   Gastrointestinal: Negative.   Genitourinary: Positive for vaginal discharge, genital sores, vaginal pain and dyspareunia.  Musculoskeletal: Negative.   Skin: Negative.   Neurological: Negative.   Psychiatric/Behavioral: Positive for sleep disturbance and dysphoric mood. The patient is nervous/anxious.        Objective:   Physical Exam  Constitutional: She is oriented to person, place, and time. She appears well-developed and well-nourished. She appears distressed.  Abdominal: Soft. She exhibits no distension. There is no tenderness. There is no rebound and no guarding.  Genitourinary:  Multiple lesions consistent with HSV on both labia areas.  HSV culture is taken from the largest lesion right labia minora.  She is very painful to obtaining cultures.  Did not do internal exam and obtain cultures due to pain - will plan to do STD's at recheck.  1+ inguinal nodes that are tender.  Neurological: She is alert and oriented to person, place, and time.  Skin: Skin is warm and dry.  Psychiatric: She has a normal mood and affect. Her behavior is normal. Judgment and thought content normal.  Very emotional about today's exam.        Assessment:     Lesions consistent with HSV Emotional upset    Plan:     Start on Valtrex 1 GM BID for 10 days, then plan to recheck and do other STD's at that time. Xylocaine 2 % gel given to use TID prn to help with pain and voiding Warm sitz bath prn Given Rx for Ativan 0.5 mg to help her with rest and anxiety over the next several weeks only given # 30 without any further refills.

## 2012-11-25 NOTE — Telephone Encounter (Signed)
Patient calling with "vaginal sores and a lot of pain." Patient has not experienced these symptoms before. Patient wants to be seen as soon as possible today.

## 2012-11-25 NOTE — Telephone Encounter (Signed)
Patient scheduled for today at 1430 with Lauro Franklin, FNP

## 2012-11-25 NOTE — Patient Instructions (Signed)
Genital Herpes  Genital herpes is a sexually transmitted disease. This means that it is a disease passed by having sex with an infected person. There is no cure for genital herpes. The time between attacks can be months to years. The virus may live in a person but produce no problems (symptoms). This infection can be passed to a baby as it travels down the birth canal (vagina). In a newborn, this can cause central nervous system damage, eye damage, or even death. The virus that causes genital herpes is usually HSV-2 virus. The virus that causes oral herpes is usually HSV-1. The diagnosis (learning what is wrong) is made through culture results.  SYMPTOMS   Usually symptoms of pain and itching begin a few days to a week after contact. It first appears as small blisters that progress to small painful ulcers which then scab over and heal after several days. It affects the outer genitalia, birth canal, cervix, penis, anal area, buttocks, and thighs.  HOME CARE INSTRUCTIONS   · Keep ulcerated areas dry and clean.  · Take medications as directed. Antiviral medications can speed up healing. They will not prevent recurrences or cure this infection. These medications can also be taken for suppression if there are frequent recurrences.  · While the infection is active, it is contagious. Avoid all sexual contact during active infections.  · Condoms may help prevent spread of the herpes virus.  · Practice safe sex.  · Wash your hands thoroughly after touching the genital area.  · Avoid touching your eyes after touching your genital area.  · Inform your caregiver if you have had genital herpes and become pregnant. It is your responsibility to insure a safe outcome for your baby in this pregnancy.  · Only take over-the-counter or prescription medicines for pain, discomfort, or fever as directed by your caregiver.  SEEK MEDICAL CARE IF:   · You have a recurrence of this infection.  · You do not respond to medications and are not  improving.  · You have new sources of pain or discharge which have changed from the original infection.  · You have an oral temperature above 102° F (38.9° C).  · You develop abdominal pain.  · You develop eye pain or signs of eye infection.  Document Released: 12/17/1999 Document Revised: 03/13/2011 Document Reviewed: 01/06/2009  ExitCare® Patient Information ©2014 [employer].

## 2012-11-27 ENCOUNTER — Telehealth: Payer: Self-pay | Admitting: Nurse Practitioner

## 2012-11-27 LAB — HERPES SIMPLEX VIRUS CULTURE: Organism ID, Bacteria: DETECTED

## 2012-11-27 NOTE — Telephone Encounter (Signed)
Patient is having pain in her vaginal area. Has been going since Saturday. Was seen 2 days ago for it and it is just getting worse.

## 2012-11-27 NOTE — Progress Notes (Signed)
Encounter reviewed by Dr. Brook Silva.  

## 2012-11-27 NOTE — Telephone Encounter (Signed)
Spoke with patient. She c/o pain with voiding and pain with sitting down. States she is crying when going to the bathroom due to pain on vulva. Taking all medications as prescribed by Lauro Franklin, FNP. She is able to void despite pain. No abdominal pain or fevers.   Spoke with Dr. Edward Jolly who advised peribottle while urinating or using tub or shower to help,  Anti inflamatory pain medicine-motrin, use lidocaine gel liberally. Advised to call back if worsens. Patient agreeable to plan. Will use warm water baths and continue on motrin.

## 2012-12-02 ENCOUNTER — Telehealth: Payer: Self-pay | Admitting: Nurse Practitioner

## 2012-12-02 NOTE — Telephone Encounter (Signed)
Left message that I was calling her about test results and to get a progress report.

## 2012-12-05 ENCOUNTER — Ambulatory Visit (INDEPENDENT_AMBULATORY_CARE_PROVIDER_SITE_OTHER): Payer: BC Managed Care – PPO | Admitting: Nurse Practitioner

## 2012-12-05 ENCOUNTER — Other Ambulatory Visit: Payer: Self-pay | Admitting: Nurse Practitioner

## 2012-12-05 ENCOUNTER — Encounter: Payer: Self-pay | Admitting: Nurse Practitioner

## 2012-12-05 VITALS — BP 100/68 | HR 60 | Ht 68.0 in | Wt 151.0 lb

## 2012-12-05 DIAGNOSIS — Z113 Encounter for screening for infections with a predominantly sexual mode of transmission: Secondary | ICD-10-CM

## 2012-12-05 MED ORDER — VALACYCLOVIR HCL 1 G PO TABS: ORAL_TABLET | ORAL | Status: DC

## 2012-12-05 NOTE — Progress Notes (Signed)
Subjective:     Patient ID: Anna [name], female   DOB: [birth year], 22 y.o.   MRN: [id number]  HPI  This 22 yo WS Fe presents for a recheck for an episode of vaginal sores and herpes culture was positive for type II HSV.  She has completed meds last pm.  She has discussed with partner and he again states that he was tested for everything in the spring and was negative.  Now not so sure about HSV.   She had a lot of pain with voiding and was very achy with not being comfortable at all for 4 days.  Her parents thought she had the flu. She has not been able to discuss this with them as she thinks they will not be favorable to their relationship.   Review of Systems  Constitutional: Positive for fever and fatigue.  HENT: Negative.   Respiratory: Negative.   Cardiovascular: Negative.   Gastrointestinal: Negative.   Genitourinary: Positive for genital sores and vaginal pain. Negative for dysuria, urgency, frequency, hematuria, flank pain and pelvic pain.  Musculoskeletal: Positive for arthralgias.  Neurological: Negative.   Psychiatric/Behavioral: Positive for dysphoric mood. The patient is nervous/anxious.        Objective:   Physical Exam  Constitutional: She is oriented to person, place, and time. She appears well-developed and well-nourished. No distress.  Abdominal: Soft. She exhibits no distension. There is no tenderness. There is no rebound and no guarding.  No flank pain  Genitourinary:  Multiple areas of lesions are much improved - about 98 % better.  One area right labia is still drying stage. Normal vaginal discharge. No cervicitis - specimen for GC & Chl is obtained.  Musculoskeletal: Normal range of motion.  Neurological: She is alert and oriented to person, place, and time.  Psychiatric: She has a normal mood and affect. Her behavior is normal. Judgment and thought content normal.       Assessment:     Culture proven HSV II genitalis    Plan:     Will continue with treatment  of Valtrex 1 gm until all lesions have cleared, then go on 1/2 tablet daily BID for 3 days if symptoms and 1/2 tablet daily for maintenance. Rx Valtrex for a year.   Will follow with lab test results

## 2012-12-05 NOTE — Patient Instructions (Signed)
Genital Herpes  Genital herpes is a sexually transmitted disease. This means that it is a disease passed by having sex with an infected person. There is no cure for genital herpes. The time between attacks can be months to years. The virus may live in a person but produce no problems (symptoms). This infection can be passed to a baby as it travels down the birth canal (vagina). In a newborn, this can cause central nervous system damage, eye damage, or even death. The virus that causes genital herpes is usually HSV-2 virus. The virus that causes oral herpes is usually HSV-1. The diagnosis (learning what is wrong) is made through culture results.  SYMPTOMS   Usually symptoms of pain and itching begin a few days to a week after contact. It first appears as small blisters that progress to small painful ulcers which then scab over and heal after several days. It affects the outer genitalia, birth canal, cervix, penis, anal area, buttocks, and thighs.  HOME CARE INSTRUCTIONS   · Keep ulcerated areas dry and clean.  · Take medications as directed. Antiviral medications can speed up healing. They will not prevent recurrences or cure this infection. These medications can also be taken for suppression if there are frequent recurrences.  · While the infection is active, it is contagious. Avoid all sexual contact during active infections.  · Condoms may help prevent spread of the herpes virus.  · Practice safe sex.  · Wash your hands thoroughly after touching the genital area.  · Avoid touching your eyes after touching your genital area.  · Inform your caregiver if you have had genital herpes and become pregnant. It is your responsibility to insure a safe outcome for your baby in this pregnancy.  · Only take over-the-counter or prescription medicines for pain, discomfort, or fever as directed by your caregiver.  SEEK MEDICAL CARE IF:   · You have a recurrence of this infection.  · You do not respond to medications and are not  improving.  · You have new sources of pain or discharge which have changed from the original infection.  · You have an oral temperature above 102° F (38.9° C).  · You develop abdominal pain.  · You develop eye pain or signs of eye infection.  Document Released: 12/17/1999 Document Revised: 03/13/2011 Document Reviewed: 01/06/2009  ExitCare® Patient Information ©2014 [employer].

## 2012-12-10 LAB — HSV(HERPES SMPLX)ABS-I+II(IGG+IGM)-BLD: Herpes Simplex Vrs I&II-IgM Ab (EIA): 6.93 INDEX — ABNORMAL HIGH

## 2012-12-11 ENCOUNTER — Telehealth: Payer: Self-pay | Admitting: Nurse Practitioner

## 2012-12-11 MED ORDER — FLUCONAZOLE 150 MG PO TABS: 150.0000 mg | ORAL_TABLET | Freq: Once | ORAL | Status: DC

## 2012-12-11 NOTE — Telephone Encounter (Signed)
Notified patient of Herpes IGG and IGM testing.  She does not have signs of an old infection - and no prior history of outbreaks.  She does show positive for IGM consistent with new infection.  Her partner is going for testing later this week.  She is also still not feeling uncomfortable with her labia.  She is instructed to go back to original dose of Valtrex for a few more days.  For the itching and vaginal discharge will give her Diflucan X 2. She will also come off OCP to have a regular menses since she is spotting for past several days.  After this cycle she can then go back to continuous active pills if desired.

## 2012-12-12 NOTE — Progress Notes (Signed)
Reviewed personally.  M. Suzanne Elan Brainerd, MD.  

## 2012-12-30 ENCOUNTER — Other Ambulatory Visit: Payer: Self-pay | Admitting: Nurse Practitioner

## 2012-12-30 MED ORDER — VALACYCLOVIR HCL 1 G PO TABS: ORAL_TABLET | ORAL | Status: DC

## 2013-01-24 ENCOUNTER — Ambulatory Visit (INDEPENDENT_AMBULATORY_CARE_PROVIDER_SITE_OTHER): Payer: BC Managed Care – PPO | Admitting: Physician Assistant

## 2013-01-24 VITALS — BP 118/70 | HR 69 | Temp 98.9°F | Resp 16 | Ht 68.0 in | Wt 155.0 lb

## 2013-01-24 DIAGNOSIS — Z202 Contact with and (suspected) exposure to infections with a predominantly sexual mode of transmission: Secondary | ICD-10-CM

## 2013-01-24 DIAGNOSIS — Z0000 Encounter for general adult medical examination without abnormal findings: Secondary | ICD-10-CM

## 2013-01-24 DIAGNOSIS — Z111 Encounter for screening for respiratory tuberculosis: Secondary | ICD-10-CM

## 2013-01-24 LAB — POCT URINALYSIS DIPSTICK
Bilirubin, UA: NEGATIVE
Glucose, UA: NEGATIVE
Ketones, UA: NEGATIVE
Leukocytes, UA: NEGATIVE
Nitrite, UA: NEGATIVE
Spec Grav, UA: 1.02
Urobilinogen, UA: 1
pH, UA: 7

## 2013-01-24 LAB — POCT CBC
Granulocyte percent: 46.8 %G (ref 37–80)
HCT, POC: 42 % (ref 37.7–47.9)
Hemoglobin: 13.1 g/dL (ref 12.2–16.2)
Lymph, poc: 1.8 (ref 0.6–3.4)
MCH, POC: 32.2 pg — AB (ref 27–31.2)
MCHC: 31.2 g/dL — AB (ref 31.8–35.4)
MCV: 103.3 fL — AB (ref 80–97)
MID (cbc): 0.3 (ref 0–0.9)
MPV: 8.6 fL (ref 0–99.8)
POC Granulocyte: 1.8 — AB (ref 2–6.9)
POC LYMPH PERCENT: 45.7 %L (ref 10–50)
POC MID %: 7.5 %M (ref 0–12)
Platelet Count, POC: 247 10*3/uL (ref 142–424)
RBC: 4.07 M/uL (ref 4.04–5.48)
RDW, POC: 14.4 %
WBC: 3.9 10*3/uL — AB (ref 4.6–10.2)

## 2013-01-24 LAB — COMPREHENSIVE METABOLIC PANEL
ALT: <8 U/L (ref 0–35)
AST: 22 U/L (ref 0–37)
Albumin: 4.1 g/dL (ref 3.5–5.2)
Alkaline Phosphatase: 13 U/L — ABNORMAL LOW (ref 39–117)
BUN: 7 mg/dL (ref 6–23)
CO2: 26 mEq/L (ref 19–32)
Calcium: 9.8 mg/dL (ref 8.4–10.5)
Chloride: 106 mEq/L (ref 96–112)
Creat: 0.96 mg/dL (ref 0.50–1.10)
Glucose, Bld: 78 mg/dL (ref 70–99)
Potassium: 4 mEq/L (ref 3.5–5.3)
Sodium: 141 mEq/L (ref 135–145)
Total Bilirubin: 0.4 mg/dL (ref 0.3–1.2)
Total Protein: 6.7 g/dL (ref 6.0–8.3)

## 2013-01-24 LAB — LIPID PANEL
Cholesterol: 194 mg/dL (ref 0–200)
HDL: 53 mg/dL (ref >39)
LDL Cholesterol: 118 mg/dL — ABNORMAL HIGH (ref 0–99)
Total CHOL/HDL Ratio: 3.7 Ratio
Triglycerides: 116 mg/dL (ref <150)
VLDL: 23 mg/dL (ref 0–40)

## 2013-01-24 LAB — TSH: TSH: 1.661 u[IU]/mL (ref 0.350–4.500)

## 2013-01-24 NOTE — Progress Notes (Signed)
Subjective:    Patient ID: Anna [name], female    DOB: [birth year], 23 y.o.   MRN: [id number]  HPI  23 year old female presents for CPE for school. 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  Doing well. No issues or complaints.  She is healthy with no known medical problems or daily medications.  Unsure of last tetanus but likely within 10 years.  States for this physical she only needs proof of Hep B series, TB test, and flu shot.    Had well woman exam including pap in 06/2012 - all normal  Patient has no other concerns today.   Review of Systems  Constitutional: Negative.   HENT: Negative.   Eyes: Negative.   Respiratory: Negative.   Cardiovascular: Negative.   Gastrointestinal: Negative.   Endocrine: Negative.   Genitourinary: Negative.   Musculoskeletal: Negative.   Skin: Negative.   Allergic/Immunologic: Negative.   Neurological: Negative.   Hematological: Negative.   Psychiatric/Behavioral: Negative.        Objective:   Physical Exam  Constitutional: She is oriented to person, place, and time. She appears well-developed and well-nourished.  HENT:  Head: Normocephalic and atraumatic.  Right Ear: External ear normal.  Left Ear: External ear normal.  Mouth/Throat: Oropharynx is clear and moist.  Eyes: Conjunctivae and EOM are normal. Pupils are equal, round, and reactive to light.  Neck: Normal range of motion. Neck supple. No thyromegaly present.  Cardiovascular: Normal rate, regular rhythm and normal heart sounds.   Pulmonary/Chest: Effort normal and breath sounds normal.  Abdominal: Soft. Bowel sounds are normal. There is no tenderness. There is no rebound and no guarding.  Lymphadenopathy:    She has no cervical adenopathy.  Neurological: She is alert and oriented to person, place, and time.  Psychiatric: She has a normal mood and affect. Her behavior is normal. Judgment and thought content normal.    Results for orders placed  in visit on 01/24/13  POCT CBC      Result Value Range   WBC 3.9 (*) 4.6 - 10.2 K/uL   Lymph, poc 1.8  0.6 - 3.4   POC LYMPH PERCENT 45.7  10 - 50 %L   MID (cbc) 0.3  0 - 0.9   POC MID % 7.5  0 - 12 %M   POC Granulocyte 1.8 (*) 2 - 6.9   Granulocyte percent 46.8  37 - 80 %G   RBC 4.07  4.04 - 5.48 M/uL   Hemoglobin 13.1  12.2 - 16.2 g/dL   HCT, POC 42.0  37.7 - 47.9 %   MCV 103.3 (*) 80 - 97 fL   MCH, POC 32.2 (*) 27 - 31.2 pg   MCHC 31.2 (*) 31.8 - 35.4 g/dL   RDW, POC 14.4     Platelet Count, POC 247  142 - 424 K/uL   MPV 8.6  0 - 99.8 fL  POCT URINALYSIS DIPSTICK      Result Value Range   Color, UA yellow     Clarity, UA cloudy     Glucose, UA neg     Bilirubin, UA neg     Ketones, UA neg     Spec Grav, UA 1.020     Blood, UA trace     pH, UA 7.0     Protein, UA trace     Urobilinogen, UA 1.0     Nitrite, UA neg     Leukocytes, UA Negative  Assessment & Plan:  Routine general medical examination at a health care facility - Plan: POCT CBC, POCT urinalysis dipstick, Comprehensive metabolic panel, Lipid panel, TSH  Screening examination for pulmonary tuberculosis - Plan: TB Skin Test  Exposure to venereal disease - Plan: Hepatitis B surface antibody  Labs pending Will draw Hep B surface antibody to determine immunity.   TB test placed. Return in 48-72 hours for TB read Recommend she find out when last tetanus was for her records

## 2013-01-24 NOTE — Progress Notes (Signed)
  Tuberculosis Risk Questionnaire  1. No Were you born outside the [place] in one of the following parts of the world: [place], [place], [place], [place] or [place]?    2. No Have you traveled outside the [place] and lived for more than one month in one of the following parts of the world: [place], [place], [place], [place] or [place]?    3. No Do you have a compromised immune system such as from any of the following conditions:HIV/AIDS, organ or bone marrow transplantation, diabetes, immunosuppressive medicines (e.g. Prednisone, Remicaide), leukemia, lymphoma, cancer of the head or neck, gastrectomy or jejunal bypass, end-stage renal disease (on dialysis), or silicosis?     4. Yes Occupation Therapy Have you ever or do you plan on working in: a residential care center, a health care facility, a jail or prison or homeless shelter?    5. No Have you ever: injected illegal drugs, used crack cocaine, lived in a homeless shelter  or been in jail or prison?     6. No Have you ever been exposed to anyone with infectious tuberculosis?    Tuberculosis Symptom Questionnaire  Do you currently have any of the following symptoms?  1. No Unexplained cough lasting more than 3 weeks?   2. No Unexplained fever lasting more than 3 weeks.   3. No Night Sweats (sweating that leaves the bedclothes and sheets wet)     4. No Shortness of Breath   5. No Chest Pain   6. No Unintentional weight loss    7. No Unexplained fatigue (very tired for no reason)

## 2013-01-25 ENCOUNTER — Telehealth: Payer: Self-pay

## 2013-01-25 LAB — HEPATITIS B SURFACE ANTIBODY, QUANTITATIVE: Hep B S AB Quant (Post): 49.7 m[IU]/mL

## 2013-01-25 NOTE — Telephone Encounter (Signed)
Patient returning call for lab results. 

## 2013-01-26 ENCOUNTER — Ambulatory Visit (INDEPENDENT_AMBULATORY_CARE_PROVIDER_SITE_OTHER): Payer: BC Managed Care – PPO | Admitting: *Deleted

## 2013-01-26 DIAGNOSIS — Z111 Encounter for screening for respiratory tuberculosis: Secondary | ICD-10-CM

## 2013-01-26 LAB — TB SKIN TEST
Induration: 0 mm
TB Skin Test: NEGATIVE

## 2013-01-27 NOTE — Telephone Encounter (Signed)
Message left on pt's machine this morning

## 2013-02-05 ENCOUNTER — Telehealth: Payer: Self-pay | Admitting: Nurse Practitioner

## 2013-02-05 NOTE — Telephone Encounter (Signed)
Dr. Quincy Simmonds, okay to place new order?

## 2013-02-05 NOTE — Telephone Encounter (Signed)
Per patient, her pharmacy denied RX for valacyclovir due way RX was written with regards to dosage.  [place] [address]

## 2013-02-05 NOTE — Telephone Encounter (Signed)
Anna [name] patient has been on 1 gram daily.  Do you want to change her to to the Valtrex 500 mg daily, increase to bid x 3 days with outbreaks so that she does not have this problem in the future?

## 2013-02-05 NOTE — Telephone Encounter (Signed)
Returning a call to Tracy °

## 2013-02-05 NOTE — Telephone Encounter (Signed)
Spoke with patient. She is currently having an outbreak since she has not had any Valtrex x 4 days. She states her insurance company only allows for 30 days supply. She has currently been on 1 gram daily, without any outbreaks.  States she needs an rx for 1 gram per day written for 30 days.  [facility], they have current rx. States that [employer] states plan limit exceeded. Prior authorization initiated with express scripts at [phone] ID number [id number].   Plan allows for 100,000 over 90 days:  Patient picked up 11/24 20 tablets for 10 days.  12/4 30 tablets for 30 days.  12/29 30 tablets for 30 days.  2/4 20 tablets for 20 days.   Then, would be 90 days at 02/23/13.   Spoke with [facility]. They ran rx through for 20 days.   Spoke with patient to advise that 20 days worth of rx would be ready for her at [facility].

## 2013-02-05 NOTE — Telephone Encounter (Signed)
Patty, okay to place new order for Valtrex 500 mg daily, increase to bid x 3 days with outbreaks, dispense 90/1 refill?  Message left to return call to [place] at [phone].

## 2013-02-05 NOTE — Telephone Encounter (Signed)
Than you for assisting in the care of this patient. The idea of doing prophylaxis of 500 mg daily is a reasonable option, so she will not run out in the future if she has an outbreak.

## 2013-02-07 ENCOUNTER — Other Ambulatory Visit: Payer: Self-pay | Admitting: Nurse Practitioner

## 2013-02-07 MED ORDER — VALACYCLOVIR HCL 500 MG PO TABS: ORAL_TABLET | ORAL | Status: DC

## 2013-03-04 ENCOUNTER — Telehealth: Payer: Self-pay | Admitting: Orthopedic Surgery

## 2013-03-04 NOTE — Telephone Encounter (Signed)
LM on pt's VM confirming name that she will be able to pick up 30 tablets of valtrex in 2 days. No prior authorization was actually needed. Pt to call back with any problems.

## 2013-04-17 ENCOUNTER — Telehealth: Payer: Self-pay | Admitting: Nurse Practitioner

## 2013-04-17 ENCOUNTER — Other Ambulatory Visit: Payer: Self-pay | Admitting: Nurse Practitioner

## 2013-04-17 MED ORDER — LEVONORGESTREL-ETHINYL ESTRAD 0.15-30 MG-MCG PO TABS: 1.0000 | ORAL_TABLET | Freq: Every day | ORAL | Status: DC

## 2013-04-17 NOTE — Telephone Encounter (Signed)
Last AEX and refill 06/12/12 #3 packs/ 3 refills Next appt 06/19/2013.   Will refill once until appt 06/2013.  - Patient notified.

## 2013-04-17 NOTE — Telephone Encounter (Signed)
Patient is requesting refills for Anna [name] to be sent to [facility] at [address] [phone]. Pt usually gets her medication through [facility] but has 1 pill left and would like it called into a local pharmacy instead. AEX scheduled for 06-19-13.

## 2013-05-12 ENCOUNTER — Encounter: Payer: Self-pay | Admitting: Nurse Practitioner

## 2013-06-19 ENCOUNTER — Ambulatory Visit: Payer: BC Managed Care – PPO | Admitting: Nurse Practitioner

## 2013-06-26 ENCOUNTER — Other Ambulatory Visit: Payer: Self-pay | Admitting: Nurse Practitioner

## 2013-06-26 NOTE — Telephone Encounter (Signed)
eScribe request from [facility] for refill on [place] Last filled - 04/17/13, #3 X 0 Last AEX - 06/12/12 Next AEX - 07/01/13  I spoke to pt, she states she has 2 pills left and will need refill prior to appt on Tuesday.   RX sent.

## 2013-07-01 ENCOUNTER — Encounter: Payer: Self-pay | Admitting: Nurse Practitioner

## 2013-07-01 ENCOUNTER — Ambulatory Visit (INDEPENDENT_AMBULATORY_CARE_PROVIDER_SITE_OTHER): Payer: BC Managed Care – PPO | Admitting: Nurse Practitioner

## 2013-07-01 VITALS — BP 110/76 | HR 68 | Resp 18 | Ht 68.5 in | Wt 145.0 lb

## 2013-07-01 DIAGNOSIS — Z01419 Encounter for gynecological examination (general) (routine) without abnormal findings: Secondary | ICD-10-CM

## 2013-07-01 DIAGNOSIS — Z0000 Encounter for general adult medical examination without abnormal findings: Secondary | ICD-10-CM

## 2013-07-01 DIAGNOSIS — Z113 Encounter for screening for infections with a predominantly sexual mode of transmission: Secondary | ICD-10-CM

## 2013-07-01 LAB — POCT URINALYSIS DIPSTICK
Bilirubin, UA: NEGATIVE
Blood, UA: NEGATIVE
Glucose, UA: NEGATIVE
KETONES UA: NEGATIVE
Leukocytes, UA: NEGATIVE
Nitrite, UA: NEGATIVE
PH UA: 5
PROTEIN UA: NEGATIVE
UROBILINOGEN UA: NEGATIVE

## 2013-07-01 MED ORDER — VALACYCLOVIR HCL 500 MG PO TABS: ORAL_TABLET | ORAL | Status: DC

## 2013-07-01 MED ORDER — LEVONORGESTREL-ETHINYL ESTRAD 0.15-30 MG-MCG PO TABS: ORAL_TABLET | ORAL | Status: DC

## 2013-07-01 NOTE — Progress Notes (Signed)
Encounter reviewed by Dr. Brook Silva.  

## 2013-07-01 NOTE — Progress Notes (Signed)
23 y.o. G0P0 Single Caucasian Fe here for annual exam.  Menses normal lasting for 5-6 days and light..  No cramps. Same partner.  She has had no weight issues recently. (Previously at [facility] in 2013 with anorexia and weight at 105)  Patient's last menstrual period was 06/04/2013.          Sexually active: Yes.    The current method of family planning is OCP (estrogen/progesterone).    Exercising: Yes.    Cardio, Weights 5 x weekly Smoker:  no  Health Maintenance: Pap:  06/12/12 neg (history of ASCUS + HR HPV) TDaP:  2014 Gardasil completed 2007 Labs: Pt declined  urine negative   reports that she has never smoked. She has never used smokeless tobacco. She reports that she does not drink alcohol or use illicit drugs.  Past Medical History  Diagnosis Date  . Anxiety   . Depression   . IBS (irritable bowel syndrome)   . 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 Eating disorder     binges and purges daily   . STD (sexually transmitted disease) 11/2012    Genital HSV II - culture proven    Past Surgical History  Procedure Laterality Date  . Colposcopy w/ biopsy / curettage  03/16/10    mild dysplasia HPV effect, CIN I    Current Outpatient Prescriptions  Medication Sig Dispense Refill  . FLUoxetine (PROZAC) 20 MG capsule Take 60 mg by mouth daily.      Marland Kitchen levonorgestrel-ethinyl estradiol (LEVORA 0.15/30, 28,) 0.15-30 MG-MCG tablet TAKE 1 TABLET BY MOUTH DAILY  3 Package  3  . minocycline (DYNACIN) 100 MG tablet Take 1 tablet by mouth daily.      . valACYclovir (VALTREX) 500 MG tablet 1 tablet daily and for outbreak 1 tablet twice a day for 3 days prn  30 tablet  12  . ARIPiprazole (ABILIFY) 2 MG tablet Take 2 mg by mouth daily.       No current facility-administered medications for this visit.    Family History  Problem Relation Age of Onset  . Heart disease Father   . Prostate  cancer Maternal Grandfather   . Lung cancer Paternal Grandfather     ROS:  Pertinent items are noted in HPI.  Otherwise, a comprehensive ROS was negative.  Exam:   BP 110/76  Pulse 68  Resp 18  Ht 5' 8.5" (1.74 m)  Wt 145 lb (65.772 kg)  BMI 21.72 kg/m2  LMP 06/04/2013 Height: 5' 8.5" (174 cm)  Ht Readings from Last 3 Encounters:  07/01/13 5' 8.5" (1.74 m)  01/24/13 5\' 8"  (1.727 m)  12/05/12 5\' 8"  (1.727 m)    General appearance: alert, cooperative and appears stated age Head: Normocephalic, without obvious abnormality, atraumatic Neck: no adenopathy, supple, symmetrical, trachea midline and thyroid normal to inspection and palpation Lungs: clear to auscultation bilaterally Breasts: normal appearance, no masses or tenderness Heart: regular rate and rhythm Abdomen: soft, non-tender; no masses,  no organomegaly Extremities: extremities normal, atraumatic, no cyanosis or edema Skin: Skin color, texture, turgor normal. No rashes or lesions Lymph nodes: Cervical, supraclavicular, and axillary nodes normal. No abnormal inguinal nodes palpated Neurologic: Grossly normal   Pelvic: External genitalia:  no lesions              Urethra:  normal appearing urethra with no masses,  tenderness or lesions              Bartholin's and Skene's: normal                 Vagina: normal appearing vagina with normal color and discharge, no lesions              Cervix: anteverted              Pap taken: Yes.   Bimanual Exam:  Uterus:  normal size, contour, position, consistency, mobility, non-tender              Adnexa: no mass, fullness, tenderness               Rectovaginal: Confirms               Anus:  normal sphincter tone, no lesions  A:  Well Woman with normal exam  Contraception  R/O STD's  History of anorexia with weight of 105 and treatment at [facility] 2013   P:   Reviewed health and wellness pertinent to exam  Pap smear taken today  Will follow with pap and STD's  Refill  OCP for a year  Counseled on breast self exam, STD prevention, adequate intake of calcium and vitamin D, diet and exercise return annually or prn  An After Visit Summary was printed and given to the patient.

## 2013-07-01 NOTE — Patient Instructions (Signed)

## 2013-07-02 LAB — STD PANEL
HIV 1&2 Ab, 4th Generation: NONREACTIVE
Hepatitis B Surface Ag: NEGATIVE

## 2013-07-02 LAB — IPS PAP TEST WITH REFLEX TO HPV

## 2013-07-03 ENCOUNTER — Telehealth: Payer: Self-pay

## 2013-07-03 LAB — IPS N GONORRHOEA AND CHLAMYDIA BY PCR

## 2013-07-03 NOTE — Telephone Encounter (Signed)
Message copied by Gerda Diss on Thu Jul 03, 2013  5:26 PM ------      Message from: Kem Boroughs R      Created: Thu Jul 03, 2013  5:16 PM       Let patient know that STD's with Hep B, HIV, STS & GC/ Chl is negative.  Also her pap is normal 02 ------

## 2013-07-03 NOTE — Telephone Encounter (Signed)
Left a detailed message about results on her cell vm  Encounter closed

## 2014-03-07 ENCOUNTER — Other Ambulatory Visit: Payer: Self-pay | Admitting: Nurse Practitioner

## 2014-03-09 NOTE — Telephone Encounter (Signed)
Medication refill request: Valtrex 1000 Last AEX:  07/01/13 PG Next AEX: 07/08/14 PG Last MMG (if hormonal medication request): N/A Refill authorized: 07/01/13 #30/12 R to [facility].  Patient should have enough refills until 06/2014

## 2014-07-08 ENCOUNTER — Ambulatory Visit: Payer: BC Managed Care – PPO | Admitting: Nurse Practitioner

## 2014-07-16 ENCOUNTER — Ambulatory Visit: Payer: Self-pay | Admitting: Obstetrics and Gynecology

## 2014-07-23 ENCOUNTER — Encounter: Payer: Self-pay | Admitting: Obstetrics and Gynecology

## 2014-07-23 ENCOUNTER — Ambulatory Visit (INDEPENDENT_AMBULATORY_CARE_PROVIDER_SITE_OTHER): Payer: BLUE CROSS/BLUE SHIELD | Admitting: Obstetrics and Gynecology

## 2014-07-23 VITALS — BP 98/56 | HR 84 | Resp 15 | Ht 68.0 in | Wt 145.0 lb

## 2014-07-23 DIAGNOSIS — D649 Anemia, unspecified: Secondary | ICD-10-CM

## 2014-07-23 DIAGNOSIS — N926 Irregular menstruation, unspecified: Secondary | ICD-10-CM | POA: Diagnosis not present

## 2014-07-23 DIAGNOSIS — F502 Bulimia nervosa: Secondary | ICD-10-CM | POA: Diagnosis not present

## 2014-07-23 DIAGNOSIS — Z0000 Encounter for general adult medical examination without abnormal findings: Secondary | ICD-10-CM

## 2014-07-23 DIAGNOSIS — Z01419 Encounter for gynecological examination (general) (routine) without abnormal findings: Secondary | ICD-10-CM

## 2014-07-23 LAB — POCT URINALYSIS DIPSTICK
BILIRUBIN UA: NEGATIVE
Blood, UA: NEGATIVE
Glucose, UA: NEGATIVE
Ketones, UA: NEGATIVE
Leukocytes, UA: NEGATIVE
Nitrite, UA: NEGATIVE
PROTEIN UA: NEGATIVE
SPEC GRAV UA: 1.01
Urobilinogen, UA: NEGATIVE
pH, UA: 6.5

## 2014-07-23 LAB — CBC
HEMATOCRIT: 37.5 % (ref 36.0–46.0)
HEMOGLOBIN: 12.6 g/dL (ref 12.0–15.0)
MCH: 31.2 pg (ref 26.0–34.0)
MCHC: 33.6 g/dL (ref 30.0–36.0)
MCV: 92.8 fL (ref 78.0–100.0)
MPV: 9.8 fL (ref 8.6–12.4)
PLATELETS: 319 10*3/uL (ref 150–400)
RBC: 4.04 MIL/uL (ref 3.87–5.11)
RDW: 13.1 % (ref 11.5–15.5)
WBC: 5.5 10*3/uL (ref 4.0–10.5)

## 2014-07-23 LAB — POCT URINE PREGNANCY: Preg Test, Ur: NEGATIVE

## 2014-07-23 NOTE — Patient Instructions (Signed)

## 2014-07-23 NOTE — Progress Notes (Signed)
Patient ID: Anna [name], female   DOB: January 19, 1990, 24 y.o.   MRN: [id number] 24 y.o. G0P0 Single Caucasian female here for annual exam.  The patient went off OCP's last August and has been trying to get pregnant. Menses q month x 5-6 days, saturates a super tampon in 4 hours. No BTB. Moderate cramps, helped with NSAID's. +Moliminal symptoms. Frequency of intercourse is mainly on the weekends (fiance is a Pharmacist, community and leaves for work at 3 am) H/O HSV, no outbreaks except x 1. 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  She was on Prozac, ran out. Stopped last October. Not sure if she was better on or off. Feels she is okay.  She is bulemic, throwing up every day for 5-6 years. She has had treatment 4 x for anorexia in the distant past.She has a psychiatrist, he is aware. She knows the best option for her is to go into a treatment facility, but doesn't think she can go into treatment x 3 months.  Partner doesn't know she's bulimic, she is afraid to tell him. Mother is aware and is supportive.   Husband is 49, no medications, smokes tobacco. He has Fathered 3 children, youngest is 46.   PCP:  No PCP  Patient's last menstrual period was 06/22/2014.    Sexually active: Yes.    The current method of family planning is none.  Patient is trying to get pregnant.     Exercising: No.  The patient does not participate in regular exercise at present. Smoker:  no  Health Maintenance: Pap:  07-01-13 WNL  History of abnormal Pap:  Yes (history of CIN 1 in 2012), last 2 years with normal paps. TDaP:  2014  Screening Labs:  Hb today: 12.3 , Urine today: WNL   reports that she has never smoked. She has never used smokeless tobacco. She reports that she does not drink alcohol or use illicit drugs.  Past Medical History  Diagnosis Date  . Anxiety   . Depression   . IBS (irritable bowel syndrome)   . 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 Anorexia 03/2011    Treated at [facility] X 2 mos  . Eating disorder     binges and purges daily   . STD (sexually transmitted disease) 11/2012    Genital HSV II - culture proven    Past Surgical History  Procedure Laterality Date  . Colposcopy w/ biopsy / curettage  03/16/10    mild dysplasia HPV effect, CIN I  . Colposcopy      No current outpatient prescriptions on file.   No current facility-administered medications for this visit.    Family History  Problem Relation Age of Onset  . Heart disease Father   . Prostate cancer Maternal Grandfather   . Lung cancer Paternal Grandfather     ROS:  Pertinent items are noted in HPI.  Otherwise, a comprehensive ROS was negative.  Exam:   BP 98/56 mmHg  Pulse 84  Resp 15  Ht 5\' 8"  (1.727 m)  Wt 145 lb (65.772 kg)  BMI 22.05 kg/m2  LMP 06/22/2014    General appearance: alert, cooperative and appears stated age, teary at times during our discussion. Appears sad.  Head: Normocephalic, without obvious abnormality, atraumatic Neck: no adenopathy, supple, symmetrical, trachea midline and thyroid normal to inspection and palpation Lungs: clear to auscultation bilaterally Breasts: normal appearance, no masses or tenderness Heart:  regular rate and rhythm Abdomen: soft, non-tender; bowel sounds normal; no masses,  no organomegaly Extremities: extremities normal, atraumatic, no cyanosis or edema Skin: Skin color, texture, turgor normal. No rashes or lesions Lymph nodes: Cervical, supraclavicular, and axillary nodes normal. No abnormal inguinal nodes palpated Neurologic: Grossly normal  Pelvic: External genitalia:  no lesions              Urethra:  normal appearing urethra with no masses, tenderness or lesions              Bartholins and Skenes: normal                 Vagina: normal appearing vagina with normal color and discharge, no lesions              Cervix: no lesions              Pap taken: No. Bimanual Exam:  Uterus:  normal size, contour, position,  consistency, mobility, non-tender              Adnexa: normal adnexa and no mass, fullness, tenderness              Rectovaginal: Yes.  .  Confirms.              Anus:  normal sphincter tone, no lesions  Chaperone was present for exam.  Assessment:   Well woman visit with normal exam. Bulemia Infertility H/O depression  Plan: CBC, Ferritin, TSH, CMP Will work to set her up with a therapist who specializes in eating disorders No pap this year Advised her to try and hold on pregnancy until she gets the bulimia under control Recommended she be on PNV in case she gets pregnant Encouraged her to talk to her psychiatrist about treatment of her depression, recommend that if she goes on medication she alert her psychiatrist that she wants to get pregnant F/U in 1 month  After visit summary provided.

## 2014-07-24 LAB — COMPREHENSIVE METABOLIC PANEL
ALBUMIN: 4.2 g/dL (ref 3.5–5.2)
AST: 15 U/L (ref 0–37)
Alkaline Phosphatase: 18 U/L — ABNORMAL LOW (ref 39–117)
BUN: 7 mg/dL (ref 6–23)
CHLORIDE: 106 meq/L (ref 96–112)
CO2: 26 mEq/L (ref 19–32)
CREATININE: 0.77 mg/dL (ref 0.50–1.10)
Calcium: 9.2 mg/dL (ref 8.4–10.5)
Glucose, Bld: 79 mg/dL (ref 70–99)
Potassium: 3.7 mEq/L (ref 3.5–5.3)
Sodium: 142 mEq/L (ref 135–145)
Total Bilirubin: 0.5 mg/dL (ref 0.2–1.2)
Total Protein: 6.7 g/dL (ref 6.0–8.3)

## 2014-07-24 LAB — FERRITIN: Ferritin: 46 ng/mL (ref 10–291)

## 2014-07-24 LAB — LIPID PANEL
Cholesterol: 121 mg/dL (ref 0–200)
HDL: 37 mg/dL — ABNORMAL LOW (ref >=46)
LDL Cholesterol: 65 mg/dL (ref 0–99)
TRIGLYCERIDES: 94 mg/dL (ref <150)
Total CHOL/HDL Ratio: 3.3 Ratio
VLDL: 19 mg/dL (ref 0–40)

## 2014-07-24 LAB — TSH: TSH: 1.513 u[IU]/mL (ref 0.350–4.500)

## 2014-07-27 LAB — HEMOGLOBIN, FINGERSTICK: Hemoglobin, fingerstick: 12.3 g/dL (ref 12.0–16.0)

## 2014-07-30 ENCOUNTER — Telehealth: Payer: Self-pay | Admitting: Obstetrics and Gynecology

## 2014-07-30 NOTE — Telephone Encounter (Signed)
Patient is calling to get her results °

## 2014-07-30 NOTE — Telephone Encounter (Signed)
Call to patient. Advised of all lab results as directed by Dr Talbert Nan. See result note. Advised of option of [place] [phone]. First step on initial screening over the phone and then will need to call me back if decides she would like to proceed.  No clinical or demographic info given to [facility] until patient decides if proceeding. Patient will call me back with decision. Other option is [facility]. Will call her if patient prefers. Will chose PCP once patient decides on which clinic to attend.

## 2014-08-26 ENCOUNTER — Telehealth: Payer: Self-pay | Admitting: Obstetrics and Gynecology

## 2014-08-26 ENCOUNTER — Ambulatory Visit: Payer: BLUE CROSS/BLUE SHIELD | Admitting: Obstetrics and Gynecology

## 2014-08-26 NOTE — Telephone Encounter (Signed)
Patient cancelled her follow up appointment Thursday at 4 because she can't get off work.

## 2014-08-28 NOTE — Telephone Encounter (Signed)
No return call from patient regarding preference for care. Any further follow-up needed?

## 2014-08-31 NOTE — Telephone Encounter (Signed)
The patient was supposed to have a f/u appointment last week, but cancelled. It doesn't look like she rescheduled. Can we please call her and see if she will come in for a f/u appointment. There is not much else we can do unless she wants help.

## 2014-08-31 NOTE — Telephone Encounter (Signed)
Call to patient. Left message to call back.  

## 2014-09-03 NOTE — Telephone Encounter (Signed)
Follow-up call to patient. Left message to call back.  

## 2014-09-04 ENCOUNTER — Telehealth: Payer: Self-pay

## 2014-09-04 MED ORDER — VALACYCLOVIR HCL 500 MG PO TABS: ORAL_TABLET | ORAL | Status: DC

## 2014-09-04 NOTE — Telephone Encounter (Signed)
Message left to return call to Lucila Klecka at [phone].    

## 2014-09-04 NOTE — Telephone Encounter (Signed)
Medication refill request: Valacyclovir   Last AEX:  07/23/14 with JJ Next AEX: 07/29/15 with Jj Last MMG (if hormonal medication request):  Refill authorized: please advise.

## 2014-09-04 NOTE — Telephone Encounter (Signed)
Patient returned call. She would like to continue with Valtrex as needed with outbreaks. Order placed as instructed by Dr. Talbert Nan. Patient aware of instructions.  She is offered to schedule follow up with Dr. Talbert Nan, she states she does wish to schedule but does not have her schedule in front of her. She would like to call back and schedule when convenient for her.  Routing to provider for final review. Patient agreeable to disposition. Will close encounter.

## 2014-09-04 NOTE — Telephone Encounter (Signed)
Please check with the patient if she wants to be on suppression, or wants meds for outbreaks. If she wants suppression, please call in Valtrex 500 mg q day (she has such infrequent outbreaks, should be okay with one time a day), call in 90 with 3 refills. If she wants meds for outbreaks, then call in valtrex 500 mg BID x 3 days prn, #30, 1 refill. Please see if she can come in for a f/u visit.

## 2014-09-08 NOTE — Telephone Encounter (Signed)
Follow up call to patient, left message to call back. 

## 2014-09-10 NOTE — Telephone Encounter (Signed)
Yes, thank you.

## 2014-09-10 NOTE — Telephone Encounter (Signed)
Dr. Talbert Nan,  We have attempted to reach patient Anna [name] 3 with no response on this encounter, however, I did speak with her on 09/04/14 and she declined to schedule an appointment but said she would do so when she had her calendar available and would call back.  Okay to close this encounter?

## 2014-10-03 ENCOUNTER — Other Ambulatory Visit: Payer: Self-pay | Admitting: Nurse Practitioner

## 2014-10-05 NOTE — Telephone Encounter (Signed)
Called patient to discuss her request for OCP's. Given her report of being actively bulimic in July, I worry about the coverage with an oral medication. She may do better with the nuvaring or another non oral contraceptive. The patient's voicemail was full, unable to leave a message on her mobile. She ended up declining help for her bulimia in July, would like to see if she has changed her mind and how she is doing. Will call back again.

## 2014-10-05 NOTE — Telephone Encounter (Signed)
Medication refill request: Anna [name]:  07-23-14  Next [name]: 07-29-15 Last MMG (if hormonal medication request): N/A Refill authorized: please advise- this med is not on current med list- I called patient because her last visit she said she was trying to get pregnant. She is not wanting to get pregnant now. She just finished her period yesterday

## 2014-10-06 MED ORDER — DROSPIRENONE-ETHINYL ESTRADIOL 3-0.02 MG PO TABS: 1.0000 | ORAL_TABLET | Freq: Every day | ORAL | Status: DC

## 2014-10-06 NOTE — Telephone Encounter (Signed)
Returned call to the patient, left a message to call.

## 2014-10-06 NOTE — Telephone Encounter (Signed)
Medication refill request: Levora Last AEX:  07/04/14 with JJ Next AEX: 07/29/15 with JJ Last MMG (if hormonal medication request):  Refill authorized: please advise

## 2014-10-06 NOTE — Telephone Encounter (Signed)
Patient is asking for refill status.

## 2014-10-06 NOTE — Telephone Encounter (Signed)
Patient returning your call.

## 2014-10-06 NOTE — Telephone Encounter (Signed)
Spoke with the patient, she states she always takes her pills at night, generally vomiting on her way to and from work. She is no longer with her boyfriend, currently living at home. Her parents are aware of her eating disorder and this helps her. She is also trying to get a job in [place], currently driving an hour to and from work and that's when she vomits, if her ride is short she thinks it will help her vomit less. She declined the help we found her this summer, stated it was too hard to get to with work. She will let us know if she wants help. She wants a pill that will be good for acne, states her acne has been terrible. Discussed Yaz, including the potentially increased risk of clotting on Yaz, she would like to try it. No contraindications to OCP's. I recommended she use condoms for STD protection and to decrease the risk of medication failure with her bulemia. She declines the nuvaring and other forms of birth control other than pills.

## 2014-12-30 LAB — OB RESULTS CONSOLE GC/CHLAMYDIA
Chlamydia: NEGATIVE
Gonorrhea: NEGATIVE

## 2014-12-30 LAB — OB RESULTS CONSOLE ABO/RH: RH Type: POSITIVE

## 2014-12-30 LAB — OB RESULTS CONSOLE RPR: RPR: NONREACTIVE

## 2014-12-30 LAB — OB RESULTS CONSOLE HIV ANTIBODY (ROUTINE TESTING): HIV: NONREACTIVE

## 2014-12-30 LAB — OB RESULTS CONSOLE ANTIBODY SCREEN: ANTIBODY SCREEN: NEGATIVE

## 2014-12-30 LAB — OB RESULTS CONSOLE RUBELLA ANTIBODY, IGM: RUBELLA: IMMUNE

## 2014-12-30 LAB — OB RESULTS CONSOLE HEPATITIS B SURFACE ANTIGEN: Hepatitis B Surface Ag: NEGATIVE

## 2015-01-03 NOTE — L&D Delivery Note (Signed)
Delivery Note Pt pushed very well for about one hour.  At 8:27 PM a viable and healthy female was delivered via Vaginal, Spontaneous Delivery (Presentation: OA; ROT ).  APGAR: 8,9 ; weight  P.   Placenta status: delivered, intact.  Cord:  3V with the following complications: none.    Anesthesia:  epidural Episiotomy: None Lacerations: 2nd degree Suture Repair: 3.0 vicryl rapide Est. Blood Loss (mL):  200  Mom to postpartum.  Baby to Couplet care / Skin to Skin.  Bovard-Stuckert, Maybelline Kolarik 08/14/2015, 8:54 PM  Br/POPs/O+/Tdap in PNC/RI

## 2015-05-12 ENCOUNTER — Inpatient Hospital Stay (HOSPITAL_COMMUNITY): Admission: AD | Admit: 2015-05-12 | Payer: Self-pay | Source: Ambulatory Visit | Admitting: Obstetrics and Gynecology

## 2015-07-14 LAB — OB RESULTS CONSOLE GBS: STREP GROUP B AG: POSITIVE

## 2015-07-29 ENCOUNTER — Ambulatory Visit: Payer: BLUE CROSS/BLUE SHIELD | Admitting: Obstetrics and Gynecology

## 2015-08-11 ENCOUNTER — Encounter (HOSPITAL_COMMUNITY): Payer: Self-pay | Admitting: *Deleted

## 2015-08-11 ENCOUNTER — Telehealth (HOSPITAL_COMMUNITY): Payer: Self-pay | Admitting: *Deleted

## 2015-08-11 NOTE — Telephone Encounter (Signed)
Preadmission screen  

## 2015-08-14 ENCOUNTER — Inpatient Hospital Stay (HOSPITAL_COMMUNITY)
Admission: RE | Admit: 2015-08-14 | Discharge: 2015-08-16 | DRG: 774 | Disposition: A | Discharge status: Home / Self Care | Payer: BLUE CROSS/BLUE SHIELD | Source: Ambulatory Visit | Attending: Obstetrics and Gynecology | Admitting: Obstetrics and Gynecology

## 2015-08-14 ENCOUNTER — Inpatient Hospital Stay (HOSPITAL_COMMUNITY): Payer: BLUE CROSS/BLUE SHIELD | Admitting: Anesthesiology

## 2015-08-14 ENCOUNTER — Encounter (HOSPITAL_COMMUNITY): Payer: Self-pay

## 2015-08-14 DIAGNOSIS — O9832 Other infections with a predominantly sexual mode of transmission complicating childbirth: Secondary | ICD-10-CM | POA: Diagnosis present

## 2015-08-14 DIAGNOSIS — Z801 Family history of malignant neoplasm of trachea, bronchus and lung: Secondary | ICD-10-CM | POA: Diagnosis not present

## 2015-08-14 DIAGNOSIS — A6000 Herpesviral infection of urogenital system, unspecified: Secondary | ICD-10-CM | POA: Diagnosis present

## 2015-08-14 DIAGNOSIS — Z3493 Encounter for supervision of normal pregnancy, unspecified, third trimester: Secondary | ICD-10-CM

## 2015-08-14 DIAGNOSIS — Z8042 Family history of malignant neoplasm of prostate: Secondary | ICD-10-CM

## 2015-08-14 DIAGNOSIS — Z8659 Personal history of other mental and behavioral disorders: Secondary | ICD-10-CM

## 2015-08-14 DIAGNOSIS — Z3A39 39 weeks gestation of pregnancy: Secondary | ICD-10-CM | POA: Diagnosis not present

## 2015-08-14 DIAGNOSIS — K589 Irritable bowel syndrome without diarrhea: Secondary | ICD-10-CM | POA: Diagnosis present

## 2015-08-14 DIAGNOSIS — Z87410 Personal history of cervical dysplasia: Secondary | ICD-10-CM

## 2015-08-14 DIAGNOSIS — O9962 Diseases of the digestive system complicating childbirth: Secondary | ICD-10-CM | POA: Diagnosis present

## 2015-08-14 DIAGNOSIS — O99824 Streptococcus B carrier state complicating childbirth: Secondary | ICD-10-CM | POA: Diagnosis present

## 2015-08-14 DIAGNOSIS — Z3403 Encounter for supervision of normal first pregnancy, third trimester: Secondary | ICD-10-CM | POA: Diagnosis present

## 2015-08-14 HISTORY — DX: Encounter for supervision of normal pregnancy, unspecified, third trimester: Z34.93

## 2015-08-14 LAB — CBC
HEMATOCRIT: 36.5 % (ref 36.0–46.0)
HEMOGLOBIN: 13 g/dL (ref 12.0–15.0)
MCH: 33.2 pg (ref 26.0–34.0)
MCHC: 35.6 g/dL (ref 30.0–36.0)
MCV: 93.4 fL (ref 78.0–100.0)
Platelets: 162 10*3/uL (ref 150–400)
RBC: 3.91 MIL/uL (ref 3.87–5.11)
RDW: 13.6 % (ref 11.5–15.5)
WBC: 7.1 10*3/uL (ref 4.0–10.5)

## 2015-08-14 LAB — TYPE AND SCREEN
ABO/RH(D): O POS
Antibody Screen: NEGATIVE

## 2015-08-14 LAB — ABO/RH: ABO/RH(D): O POS

## 2015-08-14 MED ORDER — PHENYLEPHRINE 40 MCG/ML (10ML) SYRINGE FOR IV PUSH (FOR BLOOD PRESSURE SUPPORT)
80.0000 ug | PREFILLED_SYRINGE | INTRAVENOUS | Status: DC | PRN
  Filled 2015-08-14: qty 5

## 2015-08-14 MED ORDER — ONDANSETRON HCL 4 MG/2ML IJ SOLN: 4.0000 mg | INTRAMUSCULAR | Status: DC | PRN

## 2015-08-14 MED ORDER — OXYCODONE HCL 5 MG PO TABS: 10.0000 mg | ORAL_TABLET | ORAL | Status: DC | PRN

## 2015-08-14 MED ORDER — WITCH HAZEL-GLYCERIN EX PADS: 1.0000 "application " | MEDICATED_PAD | CUTANEOUS | Status: DC | PRN

## 2015-08-14 MED ORDER — COCONUT OIL OIL
1.0000 "application " | TOPICAL_OIL | Status: DC | PRN
  Administered 2015-08-15: 1 via TOPICAL
  Filled 2015-08-14: qty 120

## 2015-08-14 MED ORDER — EPHEDRINE 5 MG/ML INJ
10.0000 mg | INTRAVENOUS | Status: DC | PRN
  Filled 2015-08-14: qty 4

## 2015-08-14 MED ORDER — DEXTROSE 5 % IV SOLN
2.5000 10*6.[IU] | INTRAVENOUS | Status: DC
  Administered 2015-08-14 (×3): 2.5 10*6.[IU] via INTRAVENOUS
  Filled 2015-08-14 (×7): qty 2.5

## 2015-08-14 MED ORDER — ACETAMINOPHEN 325 MG PO TABS: 650.0000 mg | ORAL_TABLET | ORAL | Status: DC | PRN

## 2015-08-14 MED ORDER — OXYTOCIN BOLUS FROM INFUSION
500.0000 mL | Freq: Once | INTRAVENOUS | Status: AC
  Administered 2015-08-14: 500 mL via INTRAVENOUS

## 2015-08-14 MED ORDER — KETOROLAC TROMETHAMINE 30 MG/ML IJ SOLN: 30.0000 mg | Freq: Four times a day (QID) | INTRAMUSCULAR | Status: DC | PRN

## 2015-08-14 MED ORDER — OXYTOCIN 40 UNITS IN LACTATED RINGERS INFUSION - SIMPLE MED
2.5000 [IU]/h | INTRAVENOUS | Status: DC
  Administered 2015-08-14: 2.5 [IU]/h via INTRAVENOUS

## 2015-08-14 MED ORDER — BUTORPHANOL TARTRATE 1 MG/ML IJ SOLN: 1.0000 mg | INTRAMUSCULAR | Status: DC | PRN

## 2015-08-14 MED ORDER — OXYCODONE-ACETAMINOPHEN 5-325 MG PO TABS: 2.0000 | ORAL_TABLET | ORAL | Status: DC | PRN

## 2015-08-14 MED ORDER — FENTANYL 2.5 MCG/ML BUPIVACAINE 1/10 % EPIDURAL INFUSION (WH - ANES)
14.0000 mL/h | INTRAMUSCULAR | Status: DC | PRN
  Administered 2015-08-14 (×2): 14 mL/h via EPIDURAL
  Filled 2015-08-14: qty 125

## 2015-08-14 MED ORDER — DIBUCAINE 1 % RE OINT: 1.0000 "application " | TOPICAL_OINTMENT | RECTAL | Status: DC | PRN

## 2015-08-14 MED ORDER — OXYCODONE HCL 5 MG PO TABS: 5.0000 mg | ORAL_TABLET | ORAL | Status: DC | PRN

## 2015-08-14 MED ORDER — LACTATED RINGERS IV SOLN
INTRAVENOUS | Status: DC
  Administered 2015-08-14: 20:00:00 via INTRAVENOUS
  Administered 2015-08-14: 1000 mL via INTRAVENOUS
  Administered 2015-08-14: 08:00:00 via INTRAVENOUS

## 2015-08-14 MED ORDER — ONDANSETRON HCL 4 MG/2ML IJ SOLN
4.0000 mg | Freq: Four times a day (QID) | INTRAMUSCULAR | Status: DC | PRN
  Filled 2015-08-14: qty 2

## 2015-08-14 MED ORDER — LIDOCAINE HCL (PF) 1 % IJ SOLN
INTRAMUSCULAR | Status: DC
Start: 2015-08-14 — End: 2015-08-14
  Filled 2015-08-14: qty 30

## 2015-08-14 MED ORDER — ACETAMINOPHEN 325 MG PO TABS
650.0000 mg | ORAL_TABLET | ORAL | Status: DC | PRN
  Administered 2015-08-15 (×2): 650 mg via ORAL
  Filled 2015-08-14 (×2): qty 2

## 2015-08-14 MED ORDER — SIMETHICONE 80 MG PO CHEW: 80.0000 mg | CHEWABLE_TABLET | ORAL | Status: DC | PRN

## 2015-08-14 MED ORDER — SENNOSIDES-DOCUSATE SODIUM 8.6-50 MG PO TABS
2.0000 | ORAL_TABLET | ORAL | Status: DC
  Administered 2015-08-15 – 2015-08-16 (×2): 2 via ORAL
  Filled 2015-08-14 (×2): qty 2

## 2015-08-14 MED ORDER — PRENATAL MULTIVITAMIN CH
1.0000 | ORAL_TABLET | Freq: Every day | ORAL | Status: DC
  Administered 2015-08-15: 1 via ORAL
  Filled 2015-08-14: qty 1

## 2015-08-14 MED ORDER — DIPHENHYDRAMINE HCL 25 MG PO CAPS: 25.0000 mg | ORAL_CAPSULE | Freq: Four times a day (QID) | ORAL | Status: DC | PRN

## 2015-08-14 MED ORDER — BENZOCAINE-MENTHOL 20-0.5 % EX AERO
1.0000 "application " | INHALATION_SPRAY | CUTANEOUS | Status: DC | PRN
  Administered 2015-08-15: 1 via TOPICAL
  Filled 2015-08-14: qty 56

## 2015-08-14 MED ORDER — LACTATED RINGERS IV SOLN
500.0000 mL | INTRAVENOUS | Status: DC | PRN
Start: 2015-08-14 — End: 2015-08-15
  Administered 2015-08-14: 500 mL via INTRAVENOUS

## 2015-08-14 MED ORDER — DIPHENHYDRAMINE HCL 50 MG/ML IJ SOLN: 12.5000 mg | INTRAMUSCULAR | Status: DC | PRN

## 2015-08-14 MED ORDER — PENICILLIN G POTASSIUM 5000000 UNITS IJ SOLR
5.0000 10*6.[IU] | Freq: Once | INTRAVENOUS | Status: AC
  Administered 2015-08-14: 5 10*6.[IU] via INTRAVENOUS
  Filled 2015-08-14: qty 5

## 2015-08-14 MED ORDER — LIDOCAINE HCL (PF) 1 % IJ SOLN
30.0000 mL | INTRAMUSCULAR | Status: AC | PRN
  Administered 2015-08-14 (×2): 5 mL via SUBCUTANEOUS

## 2015-08-14 MED ORDER — VALACYCLOVIR HCL 500 MG PO TABS
500.0000 mg | ORAL_TABLET | Freq: Every day | ORAL | Status: DC
  Administered 2015-08-14: 500 mg via ORAL
  Filled 2015-08-14 (×4): qty 1

## 2015-08-14 MED ORDER — PHENYLEPHRINE 40 MCG/ML (10ML) SYRINGE FOR IV PUSH (FOR BLOOD PRESSURE SUPPORT)
80.0000 ug | PREFILLED_SYRINGE | INTRAVENOUS | Status: DC | PRN
  Administered 2015-08-14: 80 ug via INTRAVENOUS
  Filled 2015-08-14: qty 5

## 2015-08-14 MED ORDER — ONDANSETRON HCL 4 MG PO TABS: 4.0000 mg | ORAL_TABLET | ORAL | Status: DC | PRN

## 2015-08-14 MED ORDER — LACTATED RINGERS IV SOLN
500.0000 mL | Freq: Once | INTRAVENOUS | Status: DC
Start: 2015-08-14 — End: 2015-08-15

## 2015-08-14 MED ORDER — FAMOTIDINE 20 MG PO TABS
20.0000 mg | ORAL_TABLET | Freq: Every day | ORAL | Status: DC
Start: 2015-08-15 — End: 2015-08-16
  Administered 2015-08-15: 20 mg via ORAL
  Filled 2015-08-14: qty 1

## 2015-08-14 MED ORDER — IBUPROFEN 600 MG PO TABS
600.0000 mg | ORAL_TABLET | Freq: Four times a day (QID) | ORAL | Status: DC
  Administered 2015-08-15 – 2015-08-16 (×6): 600 mg via ORAL
  Filled 2015-08-14 (×6): qty 1

## 2015-08-14 MED ORDER — ZOLPIDEM TARTRATE 5 MG PO TABS: 5.0000 mg | ORAL_TABLET | Freq: Every evening | ORAL | Status: DC | PRN

## 2015-08-14 MED ORDER — LACTATED RINGERS IV SOLN: 500.0000 mL | Freq: Once | INTRAVENOUS | Status: DC

## 2015-08-14 MED ORDER — TERBUTALINE SULFATE 1 MG/ML IJ SOLN
0.2500 mg | Freq: Once | INTRAMUSCULAR | Status: DC | PRN
  Filled 2015-08-14: qty 1

## 2015-08-14 MED ORDER — SOD CITRATE-CITRIC ACID 500-334 MG/5ML PO SOLN: 30.0000 mL | ORAL | Status: DC | PRN

## 2015-08-14 MED ORDER — PHENYLEPHRINE 40 MCG/ML (10ML) SYRINGE FOR IV PUSH (FOR BLOOD PRESSURE SUPPORT)
80.0000 ug | PREFILLED_SYRINGE | INTRAVENOUS | Status: DC | PRN
  Filled 2015-08-14: qty 10
  Filled 2015-08-14: qty 5

## 2015-08-14 MED ORDER — OXYCODONE-ACETAMINOPHEN 5-325 MG PO TABS: 1.0000 | ORAL_TABLET | ORAL | Status: DC | PRN

## 2015-08-14 MED ORDER — OXYTOCIN 40 UNITS IN LACTATED RINGERS INFUSION - SIMPLE MED
1.0000 m[IU]/min | INTRAVENOUS | Status: DC
  Administered 2015-08-14: 2 m[IU]/min via INTRAVENOUS
  Filled 2015-08-14: qty 1000

## 2015-08-14 MED ORDER — MEPERIDINE HCL 25 MG/ML IJ SOLN: 6.2500 mg | INTRAMUSCULAR | Status: DC | PRN

## 2015-08-14 NOTE — Progress Notes (Signed)
Patient ID: Anna [name], female   DOB: [birth year], 25 y.o.   MRN: [id number]  Comfortable with epidural  AFVSS gen NAD FHTs 120's, mod var, category 1; some earlies toco q 2-4 min  SVE 6.5 per RN  Continue current mgmt

## 2015-08-14 NOTE — Progress Notes (Signed)
Patient ID: Anna [name], female   DOB: [birth year], 25 y.o.   MRN: [id number]  Pt getting uncomfortable with contractions  AFVSS  gen NAD FHTs 140's, good var, category 1 toco q 2-45min  AROM for clear fluid, w/o diff/comp 3.4/70/-2  Continue IOL - expect SVD

## 2015-08-14 NOTE — H&P (Signed)
Anna [name] is a 25 y.o. female G1P0 at 39+ for IOL.  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  Also h/o anorexia and bulimia.  Dated by early [place].  D/W pt IOL.  Zika screen is negative.    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 Abnormal pap 2012/2013   hx ascus with HR HPV/colpo w/bx 03/2010--CIN I  . Anorexia 03/2011   Treated at [facility] X 2 mos  . Anxiety   . Depression   . Eating disorder    binges and purges daily   . HSV (herpes simplex virus) anogenital infection   . IBS (irritable bowel syndrome)   . Normal pregnancy in third trimester 08/14/2015  . STD (sexually transmitted disease) 11/2012   Genital HSV II - culture proven   Past Surgical History:  Procedure Laterality Date  . COLPOSCOPY    . COLPOSCOPY W/ BIOPSY / CURETTAGE  03/16/10   mild dysplasia HPV effect, CIN I   Family History: family history includes Heart disease in her father; Lung cancer in her paternal grandfather; Prostate cancer in her maternal grandfather. Social History:  reports that she has never smoked. She has never used smokeless tobacco. She reports that she does not drink alcohol or use drugs.  Meds PNV, Valtrex All NKDA     Maternal Diabetes: No Genetic Screening: Normal Maternal Ultrasounds/Referrals: Normal Fetal Ultrasounds or other Referrals:  None Maternal Substance Abuse:  No Significant Maternal Medications:  Meds include: Other: Valtrex Significant Maternal Lab Results:  Lab values include: Group B Strep positive Other Comments:  Zika neg  Review of Systems  Constitutional: Negative.   HENT: Negative.   Eyes: Negative.   Respiratory: Negative.   Cardiovascular: Negative.   Gastrointestinal: Negative.   Genitourinary: Negative.   Musculoskeletal: Negative.   Skin: Negative.    Neurological: Negative.   Psychiatric/Behavioral: Negative.    Maternal Medical History:  Contractions: Frequency: irregular.   Perceived severity is moderate.    Fetal activity: Perceived fetal activity is normal.    Prenatal Complications - Diabetes: none.      Last menstrual period 10/18/2014. Maternal Exam:  Uterine Assessment: Contraction strength is moderate.  Abdomen: Patient reports no abdominal tenderness. Fundal height is appropriate for gestation.   Estimated fetal weight is 7-8#.   Fetal presentation: vertex  Introitus: Normal vulva. Normal vagina.  Cervix: Cervix evaluated by digital exam.     Physical Exam  Constitutional: She is oriented to person, place, and time. She appears well-developed and well-nourished.  HENT:  Head: Normocephalic and atraumatic.  Cardiovascular: Normal rate and regular rhythm.   Respiratory: Effort normal and breath sounds normal. No respiratory distress. She has no wheezes.  GI: Soft. Bowel sounds are normal. She exhibits no distension. There is no tenderness.  Musculoskeletal: Normal range of motion.  Neurological: She is alert and oriented to person, place, and time.  Skin: Skin is warm and dry.  Psychiatric: She has a normal mood and affect. Her behavior is normal.    Prenatal labs: ABO, Rh: O/Positive/-- (12/28 0000) Antibody: Negative (12/28 0000) Rubella: Immune (12/28 0000) RPR: Nonreactive (12/28 0000)  HBsAg: Negative (12/28 0000)  HIV: Non-reactive (12/28 0000)  GBS: Positive (07/12 0000)   Hgb 13.0/Plt 209K/Ur Cx neg/ GC neg/ Chl neg/ Varicella immune/ CF neg/dated by early US/ AFP WNL/ glucola 63 [place] nl anat, post plac  Assessment/Plan: 25yo G1P0 at 39+ for IOL AROM and pitocin for IOL - SVE 3cm Epidural, IV pain meds and nitrous prn Expect SVD   Anna [name], Anna [name] 08/14/2015, 1:25 AM

## 2015-08-14 NOTE — Anesthesia Postprocedure Evaluation (Signed)
Anesthesia Post Note  Patient: Anna [name]  Procedure(s) Performed: * No procedures listed *  Patient location during evaluation: Mother Baby Anesthesia Type: Epidural Level of consciousness: awake and alert Pain management: pain level controlled Vital Signs Assessment: post-procedure vital signs reviewed and stable Respiratory status: spontaneous breathing, nonlabored ventilation and respiratory function stable Cardiovascular status: stable Postop Assessment: no headache, no backache and epidural receding Anesthetic complications: no     Last Vitals:  Vitals:   08/14/15 1902 08/14/15 2045  BP: 92/60   Pulse: 63   Resp:  20  Temp:      Last Pain:  Vitals:   08/14/15 2045  TempSrc:   PainSc: 0-No pain   Pain Goal:                 [name] A

## 2015-08-14 NOTE — Anesthesia Preprocedure Evaluation (Signed)
Anesthesia Evaluation  Patient identified by MRN, date of birth, ID band Patient awake    Reviewed: Allergy & Precautions, NPO status , Patient's Chart, lab work & pertinent test results  Airway Mallampati: I  TM Distance: >3 FB Neck ROM: Full    Dental  (+) Teeth Intact, Dental Advisory Given   Pulmonary    breath sounds clear to auscultation       Cardiovascular  Rhythm:Regular Rate:Normal     Neuro/Psych    GI/Hepatic   Endo/Other    Renal/GU      Musculoskeletal   Abdominal   Peds  Hematology   Anesthesia Other Findings   Reproductive/Obstetrics                             Anesthesia Physical Anesthesia Plan  ASA: II  Anesthesia Plan: Epidural   Post-op Pain Management:    Induction:   Airway Management Planned:   Additional Equipment:   Intra-op Plan:   Post-operative Plan:   Informed Consent: I have reviewed the patients History and Physical, chart, labs and discussed the procedure including the risks, benefits and alternatives for the proposed anesthesia with the patient or authorized representative who has indicated his/her understanding and acceptance.   Dental advisory given  Plan Discussed with: CRNA, Anesthesiologist and Surgeon  Anesthesia Plan Comments:         Anesthesia Quick Evaluation

## 2015-08-14 NOTE — Anesthesia Pain Management Evaluation Note (Signed)
  CRNA Pain Management Visit Note  Patient: Anna [name], 25 y.o., female  "Hello I am a member of the anesthesia team at [facility]. We have an anesthesia team available at all times to provide care throughout the hospital, including epidural management and anesthesia for C-section. I don't know your plan for the delivery whether it a natural birth, water birth, IV sedation, nitrous supplementation, doula or epidural, but we want to meet your pain goals."   1.Was your pain managed to your expectations on prior hospitalizations?   No prior hospitalizations  2.What is your expectation for pain management during this hospitalization?     Epidural  3.How can we help you reach that goal? Epidural in place  Record the patient's initial score and the patient's pain goal.   Pain: 0  Pain Goal: 5 [facility] wants you to be able to say your pain was always managed very well.  Rayvon Char 08/14/2015

## 2015-08-14 NOTE — Anesthesia Procedure Notes (Signed)
Epidural Patient location during procedure: OB Start time: 08/14/2015 2:47 PM  Staffing Anesthesiologist: Avani Sensabaugh  Preanesthetic Checklist Completed: patient identified, site marked, surgical consent, pre-op evaluation, timeout performed, IV checked, risks and benefits discussed and monitors and equipment checked  Epidural Patient position: sitting Prep: site prepped and draped and DuraPrep Patient monitoring: continuous pulse ox and blood pressure Approach: midline Location: L3-L4 Injection technique: LOR saline  Needle:  Needle type: Tuohy  Needle gauge: 17 G Needle length: 9 cm and 9 Needle insertion depth: 5 cm cm Catheter type: closed end flexible Catheter size: 19 Gauge Catheter at skin depth: 10 cm Test dose: negative  Assessment Events: blood not aspirated, injection not painful, no injection resistance, negative IV test and no paresthesia

## 2015-08-15 LAB — CBC
HCT: 30.9 % — ABNORMAL LOW (ref 36.0–46.0)
Hemoglobin: 11 g/dL — ABNORMAL LOW (ref 12.0–15.0)
MCH: 33 pg (ref 26.0–34.0)
MCHC: 35.6 g/dL (ref 30.0–36.0)
MCV: 92.8 fL (ref 78.0–100.0)
PLATELETS: 139 10*3/uL — AB (ref 150–400)
RBC: 3.33 MIL/uL — ABNORMAL LOW (ref 3.87–5.11)
RDW: 13.7 % (ref 11.5–15.5)
WBC: 9.1 10*3/uL (ref 4.0–10.5)

## 2015-08-15 LAB — RPR: RPR: NONREACTIVE

## 2015-08-15 MED ORDER — LACTATED RINGERS IV SOLN: INTRAVENOUS | Status: DC

## 2015-08-15 NOTE — Lactation Note (Signed)
This note was copied from a baby's chart. Lactation Consultation Note  Patient Name: Anna [name] [id number] Date: 08/15/2015 Reason for consult: Initial assessment Breastfeeding consultation services and support information given and reviewed.  Mom states baby is cluster feeding and she is not sure if latch is good every feeding.  C/o some nipple tenderness.  Encouraged to call out for latch assist prn.  Mom using coconut oil to nipples after feedings.  Maternal Data    Feeding Feeding Type: Breast Fed Length of feed: 15 min  LATCH Score/Interventions Latch: Grasps breast easily, tongue down, lips flanged, rhythmical sucking.  Audible Swallowing: A few with stimulation  Type of Nipple: Everted at rest and after stimulation  Comfort (Breast/Nipple): Soft / non-tender     Hold (Positioning): Assistance needed to correctly position infant at breast and maintain latch.  LATCH Score: 8  Lactation Tools Discussed/Used     Consult Status Consult Status: Follow-up Date: 08/16/15 Follow-up type: In-patient    Ave Filter 08/15/2015, 2:14 PM

## 2015-08-15 NOTE — Progress Notes (Signed)
Post Partum Day 1 Subjective: no complaints, up ad lib, voiding, tolerating PO and nl lochia, pain controlled  Objective: Blood pressure 106/67, pulse (!) 58, temperature 98 F (36.7 C), resp. rate 18, height 5\' 8"  (1.727 m), weight 76.7 kg (169 lb), last menstrual period 10/18/2014, SpO2 100 %, unknown if currently breastfeeding.  Physical Exam:  General: alert and no distress Lochia: appropriate Uterine Fundus: firm    Recent Labs  08/14/15 0820 08/15/15 0524  HGB 13.0 11.0*  HCT 36.5 30.9*    Assessment/Plan: Plan for discharge tomorrow, Breastfeeding and Lactation consult.  Routine care.     LOS: 1 day   Bovard-Stuckert, Anna [name] 08/15/2015, 10:06 AM

## 2015-08-15 NOTE — Progress Notes (Signed)
Patient admitted to [facility] from [facility] via wheelchair accompanied by husband and [facility] nurse.Vitals WNL, color pale- pink, LCTA Bilat, fundus is firm and one to two fingers below the umbillicus with moderate rubra lochia. Pain scale is 1-2. Oriented to room, call light, visiting hours, paper work, etc.. Condition stable.

## 2015-08-15 NOTE — Anesthesia Postprocedure Evaluation (Signed)
Anesthesia Post Note  Patient: Anna [name]  Procedure(s) Performed: * No procedures listed *  Patient location during evaluation: Mother Baby Anesthesia Type: Epidural Level of consciousness: awake, awake and alert, oriented and patient cooperative Pain management: pain level controlled Vital Signs Assessment: post-procedure vital signs reviewed and stable Respiratory status: spontaneous breathing, nonlabored ventilation and respiratory function stable Cardiovascular status: stable Postop Assessment: no headache, no backache, no signs of nausea or vomiting and patient able to bend at knees Anesthetic complications: no     Last Vitals:  Vitals:   08/15/15 0030 08/15/15 0645  BP: 114/68 106/67  Pulse: 62 (!) 58  Resp: 18 18  Temp: 37.1 C 36.7 C    Last Pain:  Vitals:   08/15/15 0643  TempSrc:   PainSc: 1    Pain Goal: Patients Stated Pain Goal: 1 (08/15/15 0215)               [name]

## 2015-08-15 NOTE — Addendum Note (Signed)
Addendum  created 08/15/15 [id number] by Raenette Rover, CRNA   Charge Capture section accepted, Sign clinical note, Visit diagnoses modified

## 2015-08-16 MED ORDER — IBUPROFEN 600 MG PO TABS: 600.0000 mg | ORAL_TABLET | Freq: Four times a day (QID) | ORAL | 0 refills | Status: DC

## 2015-08-16 NOTE — Discharge Summary (Signed)
OB Discharge Summary     Patient Name: Anna [name] DOB: 20-Dec-1990 MRN: [id number]  Date of admission: 08/14/2015 Delivering MD: Janyth Contes   Date of discharge: 08/16/2015  Admitting diagnosis: induction Intrauterine pregnancy: [redacted]w[redacted]d     Secondary diagnosis:  Principal Problem:   SVD (spontaneous vaginal delivery) Active Problems:   Normal pregnancy in third trimester      Discharge diagnosis: Term Pregnancy [facility] Hospital course:  Induction of Labor With Vaginal Delivery   25 y.o. yo G1P1001 at [redacted]w[redacted]d was admitted to the hospital 08/14/2015 for induction of labor.  Indication for induction: Favorable cervix at term.  Patient had an uncomplicated labor course as follows: Membrane Rupture Time/Date: 12:11 PM ,08/14/2015   Intrapartum Procedures: Episiotomy: None [1]                                         Lacerations:  2nd degree [3];Periurethral [8]  Patient had delivery of a Viable infant.  Information for the patient's newborn:  Lynze, Ashmore Girl Shambra [id number]  Delivery Method: Vag-Spont   08/14/2015  Details of delivery can be found in separate delivery note.  Patient had a routine postpartum course. Patient is discharged home 08/16/15.   Physical exam Vitals:   08/15/15 0030 08/15/15 0645 08/15/15 1749 08/16/15 0600  BP: 114/68 106/67 116/66 108/66  Pulse: 62 (!) 58 (!) 55 (!) 55  Resp: 18 18 16 16   Temp: 98.8 F (37.1 C) 98 F (36.7 C) 98.3 F (36.8 C) 98.5 F (36.9 C)  TempSrc: Oral  Oral Oral  SpO2: 100%   100%  Weight:      Height:       General: alert Lochia: appropriate Uterine Fundus: firm  Labs: Lab Results  Component Value Date   WBC 9.1 08/15/2015   HGB 11.0 (L) 08/15/2015   HCT 30.9 (L) 08/15/2015   MCV 92.8 08/15/2015   PLT 139 (L) 08/15/2015   CMP Latest Ref Rng & Units 07/23/2014  Glucose 70 - 99 mg/dL 79  BUN 6 - 23 mg/dL 7   Creatinine 0.50 - 1.10 mg/dL 0.77  Sodium 135 - 145 mEq/L 142  Potassium 3.5 - 5.3 mEq/L 3.7  Chloride 96 - 112 mEq/L 106  CO2 19 - 32 mEq/L 26  Calcium 8.4 - 10.5 mg/dL 9.2  Total Protein 6.0 - 8.3 g/dL 6.7  Total Bilirubin 0.2 - 1.2 mg/dL 0.5  Alkaline Phos 39 - 117 U/L 18(L)  AST 0 - 37 U/L 15  ALT 0 - 35 U/L <8    Discharge instruction: per After Visit Summary and "Baby and Me Booklet".  After visit meds:    Medication List    STOP taking these medications   promethazine 12.5 MG tablet Commonly known as:  PHENERGAN   ranitidine 150 MG tablet Commonly known as:  ZANTAC   valACYclovir 500 MG tablet Commonly known as:  VALTREX     TAKE these medications   ibuprofen 600 MG tablet Commonly known as:  ADVIL,MOTRIN Take 1 tablet (600 mg total) by mouth every 6 (six) hours.   prenatal multivitamin Tabs tablet Take 1 tablet by mouth at bedtime.       Diet: routine diet  Activity: Advance as tolerated. Pelvic rest for 6 weeks.   Outpatient follow up:4 weeks   Newborn Data: Live born female  Birth Weight: 7 lb 11.1 oz (3490 g) APGAR: 8, 9  Baby Feeding: Breast Disposition:home with mother   08/16/2015 Clarene Duke, MD

## 2015-08-16 NOTE — Discharge Instructions (Signed)
As per discharge pamphlet °

## 2015-08-16 NOTE — Lactation Note (Signed)
This note was copied from a baby's chart. Lactation Consultation Note: Mom concerned about weight loss and how much baby is getting. Reassurance  given. Reports baby slept for 5 hours during the night and then fed at 6 and again at 7 am. Encouraged to watch for feeding cues and feed whenever she sees them. Reviewed at least 8 feedings/24 hours and diaper changes increasing day by day, Encouraged to continue to log feedings and diaper changes. Mom asking about engorgement- reviewed prevention and treatment with parents. Has pump for home. No further questions at present Reviewed our phone number to call with questions, OP appointments and BFSG as resources for support after DC. To call prn  Patient Name: Anna [name] [id number] Date: 08/16/2015 Reason for consult: Follow-up assessment   Maternal Data Formula Feeding for Exclusion: No Has patient been taught Hand Expression?: Yes Does the patient have breastfeeding experience prior to this delivery?: No  Feeding Feeding Type: Breast Fed Length of feed: 35 min  LATCH Score/Interventions Latch: Grasps breast easily, tongue down, lips flanged, rhythmical sucking.  Audible Swallowing: Spontaneous and intermittent  Type of Nipple: Everted at rest and after stimulation  Comfort (Breast/Nipple): Filling, red/small blisters or bruises, mild/mod discomfort     Hold (Positioning): No assistance needed to correctly position infant at breast.  LATCH Score: 9  Lactation Tools Discussed/Used [facility] Program: No   Consult Status Consult Status: Complete    Truddie Crumble 08/16/2015, 8:50 AM

## 2015-08-16 NOTE — Progress Notes (Signed)
PPD #2 Doing well, waiting to see if baby can go home due to jaundice Afeb, VSS Fundus firm D/c home

## 2016-04-01 DIAGNOSIS — J019 Acute sinusitis, unspecified: Secondary | ICD-10-CM | POA: Diagnosis not present

## 2016-04-21 ENCOUNTER — Other Ambulatory Visit: Payer: Self-pay | Admitting: Physician Assistant

## 2016-04-21 ENCOUNTER — Ambulatory Visit
Admission: RE | Admit: 2016-04-21 | Discharge: 2016-04-21 | Disposition: A | Discharge status: Home / Self Care | Payer: BLUE CROSS/BLUE SHIELD | Source: Ambulatory Visit | Attending: Physician Assistant | Admitting: Physician Assistant

## 2016-04-21 DIAGNOSIS — R509 Fever, unspecified: Secondary | ICD-10-CM

## 2016-05-01 DIAGNOSIS — F502 Bulimia nervosa: Secondary | ICD-10-CM | POA: Diagnosis not present

## 2016-05-01 DIAGNOSIS — R6889 Other general symptoms and signs: Secondary | ICD-10-CM | POA: Diagnosis not present

## 2016-08-30 DIAGNOSIS — N911 Secondary amenorrhea: Secondary | ICD-10-CM | POA: Diagnosis not present

## 2016-08-30 DIAGNOSIS — Z3201 Encounter for pregnancy test, result positive: Secondary | ICD-10-CM | POA: Diagnosis not present

## 2016-09-12 DIAGNOSIS — Z113 Encounter for screening for infections with a predominantly sexual mode of transmission: Secondary | ICD-10-CM | POA: Diagnosis not present

## 2016-09-12 DIAGNOSIS — O98311 Other infections with a predominantly sexual mode of transmission complicating pregnancy, first trimester: Secondary | ICD-10-CM | POA: Diagnosis not present

## 2016-09-12 DIAGNOSIS — O26891 Other specified pregnancy related conditions, first trimester: Secondary | ICD-10-CM | POA: Diagnosis not present

## 2016-09-12 DIAGNOSIS — Z3689 Encounter for other specified antenatal screening: Secondary | ICD-10-CM | POA: Diagnosis not present

## 2016-09-12 DIAGNOSIS — O99341 Other mental disorders complicating pregnancy, first trimester: Secondary | ICD-10-CM | POA: Diagnosis not present

## 2016-09-12 DIAGNOSIS — O219 Vomiting of pregnancy, unspecified: Secondary | ICD-10-CM | POA: Diagnosis not present

## 2016-09-12 DIAGNOSIS — Z3A01 Less than 8 weeks gestation of pregnancy: Secondary | ICD-10-CM | POA: Diagnosis not present

## 2016-09-12 LAB — OB RESULTS CONSOLE ABO/RH: RH TYPE: POSITIVE

## 2016-09-12 LAB — OB RESULTS CONSOLE GC/CHLAMYDIA
Chlamydia: NEGATIVE
GC PROBE AMP, GENITAL: NEGATIVE

## 2016-09-12 LAB — OB RESULTS CONSOLE RUBELLA ANTIBODY, IGM: RUBELLA: IMMUNE

## 2016-09-12 LAB — OB RESULTS CONSOLE HIV ANTIBODY (ROUTINE TESTING): HIV: NONREACTIVE

## 2016-09-12 LAB — OB RESULTS CONSOLE RPR: RPR: NONREACTIVE

## 2016-09-12 LAB — OB RESULTS CONSOLE HEPATITIS B SURFACE ANTIGEN: Hepatitis B Surface Ag: NEGATIVE

## 2016-09-12 LAB — OB RESULTS CONSOLE ANTIBODY SCREEN: ANTIBODY SCREEN: NEGATIVE

## 2016-10-16 DIAGNOSIS — Z3A12 12 weeks gestation of pregnancy: Secondary | ICD-10-CM | POA: Diagnosis not present

## 2016-10-16 DIAGNOSIS — Z3682 Encounter for antenatal screening for nuchal translucency: Secondary | ICD-10-CM | POA: Diagnosis not present

## 2016-11-14 DIAGNOSIS — Z23 Encounter for immunization: Secondary | ICD-10-CM | POA: Diagnosis not present

## 2016-11-14 DIAGNOSIS — Z36 Encounter for antenatal screening for chromosomal anomalies: Secondary | ICD-10-CM | POA: Diagnosis not present

## 2016-11-14 DIAGNOSIS — Z3A16 16 weeks gestation of pregnancy: Secondary | ICD-10-CM | POA: Diagnosis not present

## 2016-11-30 DIAGNOSIS — Z363 Encounter for antenatal screening for malformations: Secondary | ICD-10-CM | POA: Diagnosis not present

## 2016-11-30 DIAGNOSIS — Z3A18 18 weeks gestation of pregnancy: Secondary | ICD-10-CM | POA: Diagnosis not present

## 2016-11-30 DIAGNOSIS — Z8279 Family history of other congenital malformations, deformations and chromosomal abnormalities: Secondary | ICD-10-CM | POA: Diagnosis not present

## 2016-12-14 DIAGNOSIS — O368390 Maternal care for abnormalities of the fetal heart rate or rhythm, unspecified trimester, not applicable or unspecified: Secondary | ICD-10-CM | POA: Diagnosis not present

## 2017-01-02 NOTE — L&D Delivery Note (Signed)
Delivery Note Pt pushed very well for about 10 minutes for delivery.  At 4:41 PM a viable and healthy female was delivered via Vaginal, Spontaneous (Presentation: OA ; LOT ).  APGAR: 9, 9; weight  P.   Placenta status: delivered, intact .  Cord: 3V with the following complications:nuchal x 1.    Anesthesia:  epidural Episiotomy: None Lacerations: 2nd degree, B periurethral - hemostatic; midline periurethral  Suture Repair: 3.0 vicryl rapide Est. Blood Loss (mL):  200cc  Mom to postpartum.  Baby to Couplet care / Skin to Skin.  Anna [name] 04/25/2017, 5:04 PM   Br/RI/Tdap in PNC/O+/Contra?  Desires circumcision for female infamt - d/w parents r/b/a and process - wish to proceed (paid in office)

## 2017-01-19 DIAGNOSIS — R0981 Nasal congestion: Secondary | ICD-10-CM | POA: Diagnosis not present

## 2017-01-19 DIAGNOSIS — H6123 Impacted cerumen, bilateral: Secondary | ICD-10-CM | POA: Diagnosis not present

## 2017-01-30 DIAGNOSIS — Z23 Encounter for immunization: Secondary | ICD-10-CM | POA: Diagnosis not present

## 2017-01-30 DIAGNOSIS — Z3A27 27 weeks gestation of pregnancy: Secondary | ICD-10-CM | POA: Diagnosis not present

## 2017-01-30 DIAGNOSIS — Z3689 Encounter for other specified antenatal screening: Secondary | ICD-10-CM | POA: Diagnosis not present

## 2017-02-28 DIAGNOSIS — L293 Anogenital pruritus, unspecified: Secondary | ICD-10-CM | POA: Diagnosis not present

## 2017-03-02 DIAGNOSIS — R8290 Unspecified abnormal findings in urine: Secondary | ICD-10-CM | POA: Diagnosis not present

## 2017-03-23 LAB — HM PAP SMEAR

## 2017-03-27 DIAGNOSIS — Z3685 Encounter for antenatal screening for Streptococcus B: Secondary | ICD-10-CM | POA: Diagnosis not present

## 2017-03-27 LAB — OB RESULTS CONSOLE GBS: STREP GROUP B AG: NEGATIVE

## 2017-03-29 DIAGNOSIS — K644 Residual hemorrhoidal skin tags: Secondary | ICD-10-CM | POA: Diagnosis not present

## 2017-04-12 ENCOUNTER — Telehealth (HOSPITAL_COMMUNITY): Payer: Self-pay | Admitting: *Deleted

## 2017-04-12 ENCOUNTER — Encounter (HOSPITAL_COMMUNITY): Payer: Self-pay | Admitting: *Deleted

## 2017-04-12 NOTE — Telephone Encounter (Signed)
Preadmission screen  

## 2017-04-24 NOTE — H&P (Signed)
Anna [name] is a 27 y.o. female G2P1001 at 39+ for IOL.  Pregnancy dated by early [place] (7wk); Relatively uncomplicated PNC - except pt with eating disorder.  Has also struggled with GERD.  GBBS neg.  Genetic screening is normal, except positive for SMA - FOB screened negative.  FH of spin bifida.  Has paid for hospital circ.  Received flu an Tdap vaccines.  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 Fetal arrythmia at anatomy screen - nl fetal echo.      OB History    Gravida  2   Para  1   Term  1   Preterm      AB      Living  1     SAB      TAB      Ectopic      Multiple  0   Live Births  1         G1 7#11 SVD female G2 present  + abn pap - colpo - nl since H/o HSV 2 - on suppression  Past Medical History:  Diagnosis Date  . Abnormal pap 2012/2013   hx ascus with HR HPV/colpo w/bx 03/2010--CIN I  . Anorexia 03/2011   Treated at [facility] X 2 mos  . Anxiety   . Depression   . Eating disorder    binges and purges daily   . HSV (herpes simplex virus) anogenital infection   . IBS (irritable bowel syndrome)   . Normal pregnancy in third trimester 08/14/2015  . STD (sexually transmitted disease) 11/2012   Genital HSV II - culture proven  . SVD (spontaneous vaginal delivery) 08/14/2015   Past Surgical History:  Procedure Laterality Date  . COLPOSCOPY    . COLPOSCOPY W/ BIOPSY / CURETTAGE  03/16/10   mild dysplasia HPV effect, CIN I   Family History: family history includes Heart disease in her father; Lung cancer in her paternal grandfather; Prostate cancer in her maternal grandfather. Hypertension Social History:  reports that she has never smoked. She has never used smokeless tobacco. She reports that she does not drink alcohol or use drugs. 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   HENT: Negative.   Eyes: Negative.   Respiratory: Negative.   Cardiovascular: Negative.   Gastrointestinal: Positive for vomiting.       Daily  Genitourinary: Negative.   Musculoskeletal: Positive for back pain.  Skin: Negative.   Neurological: Negative.   Psychiatric/Behavioral: Negative.    Maternal Medical History:  Contractions: Frequency: irregular.    Fetal activity: Perceived fetal activity is normal.    Prenatal Complications - Diabetes: none.      Last menstrual period 07/07/2016, unknown if currently breastfeeding. Maternal Exam:  Uterine Assessment: Contraction strength is moderate.  Contraction frequency is irregular.   Abdomen: Patient reports no abdominal tenderness. Fundal height is appropriate for gestation.   Estimated fetal weight is 8-8.5#.   Fetal presentation: vertex  Introitus: Normal vulva. Normal vagina.  Cervix: Cervix evaluated by digital exam.     Physical Exam  Constitutional:  She is oriented to person, place, and time. She appears well-developed and well-nourished.  HENT:  Head: Normocephalic and atraumatic.  Cardiovascular: Normal rate and regular rhythm.  Respiratory: Effort normal and breath sounds normal. No respiratory distress. She has no wheezes.  GI: Soft. Bowel sounds are normal. She exhibits no distension. There is no tenderness.  Musculoskeletal: Normal range of motion.  Neurological: She is alert and oriented to person, place, and time.  Skin: Skin is warm and dry.  Psychiatric: She has a normal mood and affect. Her behavior is normal.    Prenatal labs: ABO, Rh: O/Positive/-- (09/11 0000) Antibody: Negative (09/11 0000) Rubella: Immune (09/11 0000) RPR: Nonreactive  (09/11 0000)  HBsAg: Negative (09/11 0000)  HIV: Non-reactive (09/11 0000)  GBS: Negative (03/26 0000)   CFF neg, SMA + - FOB neg/fragile x neg/Hgb 13.6/Plt 228/Ur Cx neg/GC neg/Chl neg/Varicella immune  Firat trimester screen and NT WNL AFP WNL  glucola 71 Flu 11/28, Tdap 1/19 [place] nl anat, some cardiac arrythmia, ant plac, female   Assessment/Plan: 27yo G2P1001at 39+ for IOL AROM and pitocin for augmentation GBBS neg Epidural prn Expect SVD  Anna [name] 04/24/2017, 8:57 PM

## 2017-04-25 ENCOUNTER — Encounter (HOSPITAL_COMMUNITY): Payer: Self-pay

## 2017-04-25 ENCOUNTER — Inpatient Hospital Stay (HOSPITAL_COMMUNITY): Payer: BLUE CROSS/BLUE SHIELD | Admitting: Anesthesiology

## 2017-04-25 ENCOUNTER — Inpatient Hospital Stay (HOSPITAL_COMMUNITY)
Admission: RE | Admit: 2017-04-25 | Discharge: 2017-04-27 | DRG: 807 | Disposition: A | Discharge status: Home / Self Care | Payer: BLUE CROSS/BLUE SHIELD | Source: Ambulatory Visit | Attending: Obstetrics and Gynecology | Admitting: Obstetrics and Gynecology

## 2017-04-25 DIAGNOSIS — Z3A39 39 weeks gestation of pregnancy: Secondary | ICD-10-CM

## 2017-04-25 DIAGNOSIS — F509 Eating disorder, unspecified: Secondary | ICD-10-CM | POA: Diagnosis present

## 2017-04-25 DIAGNOSIS — O26893 Other specified pregnancy related conditions, third trimester: Secondary | ICD-10-CM | POA: Diagnosis not present

## 2017-04-25 DIAGNOSIS — Z3483 Encounter for supervision of other normal pregnancy, third trimester: Secondary | ICD-10-CM

## 2017-04-25 DIAGNOSIS — Z23 Encounter for immunization: Secondary | ICD-10-CM | POA: Diagnosis not present

## 2017-04-25 DIAGNOSIS — R94120 Abnormal auditory function study: Secondary | ICD-10-CM | POA: Diagnosis not present

## 2017-04-25 LAB — TYPE AND SCREEN
ABO/RH(D): O POS
Antibody Screen: NEGATIVE

## 2017-04-25 LAB — CBC
HEMATOCRIT: 37 % (ref 36.0–46.0)
Hemoglobin: 12.9 g/dL (ref 12.0–15.0)
MCH: 33.3 pg (ref 26.0–34.0)
MCHC: 34.9 g/dL (ref 30.0–36.0)
MCV: 95.6 fL (ref 78.0–100.0)
Platelets: 137 10*3/uL — ABNORMAL LOW (ref 150–400)
RBC: 3.87 MIL/uL (ref 3.87–5.11)
RDW: 14.5 % (ref 11.5–15.5)
WBC: 5.7 10*3/uL (ref 4.0–10.5)

## 2017-04-25 MED ORDER — ACETAMINOPHEN 325 MG PO TABS
650.0000 mg | ORAL_TABLET | ORAL | Status: DC | PRN
  Administered 2017-04-26: 650 mg via ORAL
  Filled 2017-04-25: qty 2

## 2017-04-25 MED ORDER — COCONUT OIL OIL: 1.0000 "application " | TOPICAL_OIL | Status: DC | PRN

## 2017-04-25 MED ORDER — LACTATED RINGERS IV SOLN: 500.0000 mL | INTRAVENOUS | Status: DC | PRN

## 2017-04-25 MED ORDER — ONDANSETRON HCL 4 MG/2ML IJ SOLN
4.0000 mg | Freq: Four times a day (QID) | INTRAMUSCULAR | Status: DC | PRN
  Administered 2017-04-25: 4 mg via INTRAVENOUS
  Filled 2017-04-25: qty 2

## 2017-04-25 MED ORDER — EPHEDRINE 5 MG/ML INJ
10.0000 mg | INTRAVENOUS | Status: DC | PRN
  Filled 2017-04-25: qty 2

## 2017-04-25 MED ORDER — FENTANYL 2.5 MCG/ML BUPIVACAINE 1/10 % EPIDURAL INFUSION (WH - ANES)
14.0000 mL/h | INTRAMUSCULAR | Status: DC | PRN
  Administered 2017-04-25: 14 mL/h via EPIDURAL
  Filled 2017-04-25: qty 100

## 2017-04-25 MED ORDER — OXYCODONE HCL 5 MG PO TABS
5.0000 mg | ORAL_TABLET | ORAL | Status: DC | PRN
  Administered 2017-04-26 (×2): 5 mg via ORAL
  Filled 2017-04-25 (×2): qty 1

## 2017-04-25 MED ORDER — LIDOCAINE HCL (PF) 1 % IJ SOLN
30.0000 mL | INTRAMUSCULAR | Status: DC | PRN
  Filled 2017-04-25: qty 30

## 2017-04-25 MED ORDER — LACTATED RINGERS IV SOLN
INTRAVENOUS | Status: DC
  Administered 2017-04-25 (×2): via INTRAVENOUS

## 2017-04-25 MED ORDER — TERBUTALINE SULFATE 1 MG/ML IJ SOLN
0.2500 mg | Freq: Once | INTRAMUSCULAR | Status: DC | PRN
  Filled 2017-04-25: qty 1

## 2017-04-25 MED ORDER — LACTATED RINGERS IV SOLN: 500.0000 mL | Freq: Once | INTRAVENOUS | Status: DC

## 2017-04-25 MED ORDER — LACTATED RINGERS IV SOLN: INTRAVENOUS | Status: DC

## 2017-04-25 MED ORDER — WITCH HAZEL-GLYCERIN EX PADS: 1.0000 "application " | MEDICATED_PAD | CUTANEOUS | Status: DC | PRN

## 2017-04-25 MED ORDER — OXYTOCIN 40 UNITS IN LACTATED RINGERS INFUSION - SIMPLE MED: 2.5000 [IU]/h | INTRAVENOUS | Status: DC

## 2017-04-25 MED ORDER — SOD CITRATE-CITRIC ACID 500-334 MG/5ML PO SOLN: 30.0000 mL | ORAL | Status: DC | PRN

## 2017-04-25 MED ORDER — IBUPROFEN 600 MG PO TABS
600.0000 mg | ORAL_TABLET | Freq: Four times a day (QID) | ORAL | Status: DC
  Administered 2017-04-25 – 2017-04-27 (×6): 600 mg via ORAL
  Filled 2017-04-25 (×6): qty 1

## 2017-04-25 MED ORDER — ONDANSETRON HCL 4 MG PO TABS: 4.0000 mg | ORAL_TABLET | ORAL | Status: DC | PRN

## 2017-04-25 MED ORDER — DIPHENHYDRAMINE HCL 50 MG/ML IJ SOLN: 12.5000 mg | INTRAMUSCULAR | Status: DC | PRN

## 2017-04-25 MED ORDER — PHENYLEPHRINE 40 MCG/ML (10ML) SYRINGE FOR IV PUSH (FOR BLOOD PRESSURE SUPPORT)
80.0000 ug | PREFILLED_SYRINGE | INTRAVENOUS | Status: DC | PRN
  Filled 2017-04-25: qty 5

## 2017-04-25 MED ORDER — OXYCODONE-ACETAMINOPHEN 5-325 MG PO TABS: 1.0000 | ORAL_TABLET | ORAL | Status: DC | PRN

## 2017-04-25 MED ORDER — OXYTOCIN 40 UNITS IN LACTATED RINGERS INFUSION - SIMPLE MED
1.0000 m[IU]/min | INTRAVENOUS | Status: DC
  Administered 2017-04-25: 2 m[IU]/min via INTRAVENOUS
  Filled 2017-04-25: qty 1000

## 2017-04-25 MED ORDER — LIDOCAINE HCL (PF) 1 % IJ SOLN
INTRAMUSCULAR | Status: DC | PRN
  Administered 2017-04-25: 2 mL via EPIDURAL
  Administered 2017-04-25: 3 mL via EPIDURAL
  Administered 2017-04-25: 5 mL via EPIDURAL

## 2017-04-25 MED ORDER — ONDANSETRON HCL 4 MG/2ML IJ SOLN: 4.0000 mg | INTRAMUSCULAR | Status: DC | PRN

## 2017-04-25 MED ORDER — SIMETHICONE 80 MG PO CHEW
80.0000 mg | CHEWABLE_TABLET | ORAL | Status: DC | PRN
  Filled 2017-04-25: qty 1

## 2017-04-25 MED ORDER — BENZOCAINE-MENTHOL 20-0.5 % EX AERO
1.0000 "application " | INHALATION_SPRAY | CUTANEOUS | Status: DC | PRN
  Administered 2017-04-25: 1 via TOPICAL
  Filled 2017-04-25: qty 56

## 2017-04-25 MED ORDER — DIBUCAINE 1 % RE OINT: 1.0000 "application " | TOPICAL_OINTMENT | RECTAL | Status: DC | PRN

## 2017-04-25 MED ORDER — PANTOPRAZOLE SODIUM 40 MG PO TBEC
40.0000 mg | DELAYED_RELEASE_TABLET | Freq: Every day | ORAL | Status: DC
  Filled 2017-04-25: qty 1

## 2017-04-25 MED ORDER — OXYTOCIN BOLUS FROM INFUSION
500.0000 mL | Freq: Once | INTRAVENOUS | Status: AC
  Administered 2017-04-25: 500 mL via INTRAVENOUS

## 2017-04-25 MED ORDER — PHENYLEPHRINE 40 MCG/ML (10ML) SYRINGE FOR IV PUSH (FOR BLOOD PRESSURE SUPPORT)
80.0000 ug | PREFILLED_SYRINGE | INTRAVENOUS | Status: DC | PRN
  Filled 2017-04-25: qty 10
  Filled 2017-04-25: qty 5

## 2017-04-25 MED ORDER — OXYCODONE HCL 5 MG PO TABS: 10.0000 mg | ORAL_TABLET | ORAL | Status: DC | PRN

## 2017-04-25 MED ORDER — DIPHENHYDRAMINE HCL 25 MG PO CAPS
25.0000 mg | ORAL_CAPSULE | Freq: Four times a day (QID) | ORAL | Status: DC | PRN
Start: 2017-04-25 — End: 2017-04-27

## 2017-04-25 MED ORDER — PRENATAL MULTIVITAMIN CH
1.0000 | ORAL_TABLET | Freq: Every day | ORAL | Status: DC
  Administered 2017-04-26: 1 via ORAL
  Filled 2017-04-25: qty 1

## 2017-04-25 MED ORDER — SENNOSIDES-DOCUSATE SODIUM 8.6-50 MG PO TABS
2.0000 | ORAL_TABLET | ORAL | Status: DC
  Administered 2017-04-26 – 2017-04-27 (×2): 2 via ORAL
  Filled 2017-04-25 (×2): qty 2

## 2017-04-25 MED ORDER — ZOLPIDEM TARTRATE 5 MG PO TABS: 5.0000 mg | ORAL_TABLET | Freq: Every evening | ORAL | Status: DC | PRN

## 2017-04-25 MED ORDER — ACETAMINOPHEN 325 MG PO TABS: 650.0000 mg | ORAL_TABLET | ORAL | Status: DC | PRN

## 2017-04-25 MED ORDER — LACTATED RINGERS IV SOLN
500.0000 mL | Freq: Once | INTRAVENOUS | Status: AC
  Administered 2017-04-25: 500 mL via INTRAVENOUS

## 2017-04-25 MED ORDER — OXYCODONE-ACETAMINOPHEN 5-325 MG PO TABS: 2.0000 | ORAL_TABLET | ORAL | Status: DC | PRN

## 2017-04-25 NOTE — Anesthesia Pain Management Evaluation Note (Signed)
  CRNA Pain Management Visit Note  Patient: Anna [name], 27 y.o., female  "Hello I am a member of the anesthesia team at [facility]. We have an anesthesia team available at all times to provide care throughout the hospital, including epidural management and anesthesia for C-section. I don't know your plan for the delivery whether it a natural birth, water birth, IV sedation, nitrous supplementation, doula or epidural, but we want to meet your pain goals."   1.Was your pain managed to your expectations on prior hospitalizations?   Yes   2.What is your expectation for pain management during this hospitalization?     Epidural  3.How can we help you reach that goal? Epidural when ready  Record the patient's initial score and the patient's pain goal.   Pain: 1  Pain Goal: 5 [facility] wants you to be able to say your pain was always managed very well.  [facility] 04/25/2017

## 2017-04-25 NOTE — Anesthesia Preprocedure Evaluation (Signed)
Anesthesia Evaluation  Patient identified by MRN, date of birth, ID band Patient awake    Reviewed: Allergy & Precautions, NPO status , Patient's Chart, lab work & pertinent test results  Airway Mallampati: II  TM Distance: >3 FB Neck ROM: Full    Dental  (+) Teeth Intact, Dental Advisory Given   Pulmonary neg pulmonary ROS,    Pulmonary exam normal breath sounds clear to auscultation       Cardiovascular negative cardio ROS Normal cardiovascular exam Rhythm:Regular Rate:Normal     Neuro/Psych PSYCHIATRIC DISORDERS Anxiety Depression negative neurological ROS     GI/Hepatic negative GI ROS, Neg liver ROS,   Endo/Other  negative endocrine ROS  Renal/GU negative Renal ROS     Musculoskeletal negative musculoskeletal ROS (+)   Abdominal   Peds  Hematology  (+) Blood dyscrasia (Plt 137k), ,   Anesthesia Other Findings Day of surgery medications reviewed with the patient.  Reproductive/Obstetrics (+) Pregnancy                             Anesthesia Physical Anesthesia Plan  ASA: II  Anesthesia Plan: Epidural   Post-op Pain Management:    Induction:   PONV Risk Score and Plan: 2 and Treatment may vary due to age or medical condition  Airway Management Planned:   Additional Equipment:   Intra-op Plan:   Post-operative Plan:   Informed Consent: I have reviewed the patients History and Physical, chart, labs and discussed the procedure including the risks, benefits and alternatives for the proposed anesthesia with the patient or authorized representative who has indicated his/her understanding and acceptance.   Dental advisory given  Plan Discussed with:   Anesthesia Plan Comments: (Patient identified. Risks/Benefits/Options discussed with patient including but not limited to bleeding, infection, nerve damage, paralysis, failed block, incomplete pain control, headache, blood  pressure changes, nausea, vomiting, reactions to medication both or allergic, itching and postpartum back pain. Confirmed with bedside nurse the patient's most recent platelet count. Confirmed with patient that they are not currently taking any anticoagulation, have any bleeding history or any family history of bleeding disorders. Patient expressed understanding and wished to proceed. All questions were answered. )        Anesthesia Quick Evaluation

## 2017-04-25 NOTE — Progress Notes (Signed)
Patient ID: Anna [name], female   DOB: 03/26/1990, 27 y.o.   MRN: [id number]   Comfortable with epidural  AFVSS  gen NAD FHTs 120's, mod var, + accels, category 1 toco  q 3-4 min  SVE 5/70/-1-0  Continue IOL

## 2017-04-25 NOTE — Anesthesia Procedure Notes (Signed)
Epidural Patient location during procedure: OB Start time: 04/25/2017 10:16 AM End time: 04/25/2017 10:22 AM  Staffing Anesthesiologist: Catalina Gravel, MD Performed: anesthesiologist   Preanesthetic Checklist Completed: patient identified, pre-op evaluation, timeout performed, IV checked, risks and benefits discussed and monitors and equipment checked  Epidural Patient position: sitting Prep: DuraPrep Patient monitoring: blood pressure and continuous pulse ox Approach: midline Location: L3-L4 Injection technique: LOR air  Needle:  Needle type: Tuohy  Needle gauge: 17 G Needle length: 9 cm Needle insertion depth: 5 cm Catheter size: 19 Gauge Catheter at skin depth: 10 cm Test dose: negative and Other (1% Lidocaine)  Additional Notes Patient identified.  Risk benefits discussed including failed block, incomplete pain control, headache, nerve damage, paralysis, blood pressure changes, nausea, vomiting, reactions to medication both toxic or allergic, and postpartum back pain.  Patient expressed understanding and wished to proceed.  All questions were answered.  Sterile technique used throughout procedure and epidural site dressed with sterile barrier dressing. No paresthesia or other complications noted. The patient did not experience any signs of intravascular injection such as tinnitus or metallic taste in mouth nor signs of intrathecal spread such as rapid motor block. Please see nursing notes for vital signs. Reason for block:procedure for pain

## 2017-04-25 NOTE — Progress Notes (Signed)
Patient ID: Anna [name], female   DOB: Nov 16, 1990, 27 y.o.   MRN: [id number]   No c/o's.  Reviewed H&P, no changes.  Reviewed IOL  AFVSS gen NAD FHTs 125 mod var, + accels, category 1 toco Q 2-3 min  SVE 4/80/-2, BBOW,posterior  AROM for moderate meconium, d/w pt  Continue IOL Expect SVD

## 2017-04-26 LAB — CBC
HCT: 32.3 % — ABNORMAL LOW (ref 36.0–46.0)
Hemoglobin: 11.3 g/dL — ABNORMAL LOW (ref 12.0–15.0)
MCH: 33.3 pg (ref 26.0–34.0)
MCHC: 35 g/dL (ref 30.0–36.0)
MCV: 95.3 fL (ref 78.0–100.0)
PLATELETS: 140 10*3/uL — AB (ref 150–400)
RBC: 3.39 MIL/uL — ABNORMAL LOW (ref 3.87–5.11)
RDW: 14.5 % (ref 11.5–15.5)
WBC: 6 10*3/uL (ref 4.0–10.5)

## 2017-04-26 LAB — RPR: RPR: NONREACTIVE

## 2017-04-26 NOTE — Lactation Note (Signed)
This note was copied from a baby's chart. Lactation Consultation Note  Patient Name: Anna [name] ATFTD'D Date: 04/26/2017 Reason for consult: Initial assessment;Term;Other (Comment)(post circ / )  Baby is 41 hours old / [name] has fed breast 7 x's 15 -20 mins , and cluster feeding. Latch scores 9-10  5 voids, 8 stools.  LC reviewed and updated doc flow sheets Per mom baby has been cluster feeding for 2 hours.  Baby latched modified cradle position, non- nutritive sucking noted.  Mom instructed to release latch. LC checked/ diaper dry/ and assisted  To re - latch in the football / left breast/ depth achieved and baby fed for 27 mins, with multiple  Swallows, increased with breast compressions. Baby became non - nutritive and released,  Nipple well rounded when baby released. Baby asleep, and comfortable at moms side, STS.  Per mom comfortable through the feeding.  LC discussed nutritive vs non - nutritive feeding patterns and to watch for hanging out latched.  Mom asked about pacifiers, LC recommended holding off until after the 3 rd week growth spurt.  Per mom will have a DEBP at home.  LC instructed - hand expressing, hand pump, and recommended prior to latch - breast massage , hand Express, pre-pump if needed, and latch with breast compressions until swallows and intermittent.  Mother informed of post-discharge support and given phone number to the lactation department, including services for phone call assistance; out-patient appointments; and breastfeeding support group. List of other breastfeeding resources in the community given in the handout. Encouraged mother to call for problems or concerns related to breastfeeding.   Maternal Data Has patient been taught Hand Expression?: Yes Does the patient have breastfeeding experience prior to this delivery?: Yes  Feeding Feeding Type: Breast Fed Length of feed: (swallows noted )  LATCH Score Latch: Grasps breast easily, tongue down,  lips flanged, rhythmical sucking.  Audible Swallowing: Spontaneous and intermittent  Type of Nipple: Everted at rest and after stimulation  Comfort (Breast/Nipple): Soft / non-tender  Hold (Positioning): Assistance needed to correctly position infant at breast and maintain latch.  LATCH Score: 9  Interventions Interventions: Breast feeding basics reviewed;Assisted with latch;Skin to skin;Breast massage;Hand express;Breast compression;Adjust position;Support pillows;Position options  Lactation Tools Discussed/Used Tools: Pump Breast pump type: Manual WIC Program: No Pump Review: Setup, frequency, and cleaning;Milk Storage Initiated by:: MAI  Date initiated:: 04/26/17   Consult Status Consult Status: Follow-up Date: 04/27/17 Follow-up type: In-patient    Shafer 04/26/2017, 2:58 PM

## 2017-04-26 NOTE — Progress Notes (Signed)
Post Partum Day 1 Subjective: no complaints, up ad lib and voiding  Objective: Blood pressure 99/65, pulse (!) 55, temperature 97.9 F (36.6 C), temperature source Oral, resp. rate 18, height 5\' 9"  (1.753 m), weight 162 lb 9.6 oz (73.8 kg), last menstrual period 07/07/2016, SpO2 100 %, unknown if currently breastfeeding.  Physical Exam:  General: alert and cooperative Lochia: appropriate Uterine Fundus: firm   Recent Labs    04/25/17 0724 04/26/17 0615  HGB 12.9 11.3*  HCT 37.0 32.3*    Assessment/Plan: Plan for discharge tomorrow   LOS: 1 day   Logan Bores 04/26/2017, 8:25 AM

## 2017-04-26 NOTE — Anesthesia Postprocedure Evaluation (Signed)
Anesthesia Post Note  Patient: Anna [name]  Procedure(s) Performed: AN AD HOC LABOR EPIDURAL     Patient location during evaluation: Mother Baby Anesthesia Type: Epidural Level of consciousness: awake and alert Pain management: pain level controlled Vital Signs Assessment: post-procedure vital signs reviewed and stable Respiratory status: spontaneous breathing, nonlabored ventilation and respiratory function stable Cardiovascular status: stable Postop Assessment: no headache, no backache and epidural receding Anesthetic complications: no    Last Vitals:  Vitals:   04/25/17 2039 04/26/17 0006  BP: 115/68 116/69  Pulse: 67 (!) 55  Resp: 16 16  Temp: 36.8 C 36.9 C  SpO2:      Last Pain:  Vitals:   04/26/17 0604  TempSrc:   PainSc: 3    Pain Goal:                 Sandrea Matte

## 2017-04-26 NOTE — Progress Notes (Signed)
MOB was referred for history of depression/anxiety. * Referral screened out by Clinical Social Worker because none of the following criteria appear to apply: ~ History of anxiety/depression during this pregnancy, or of post-partum depression. ~ Diagnosis of anxiety and/or depression within last 3 years OR * MOB's symptoms currently being treated with medication and/or therapy. Please contact the Clinical Social Worker if needs arise, by MOB request, or if MOB scores greater than 9/yes to question 10 on [place] Postpartum Depression Screen.  Chanda Laperle Boyd-Gilyard, MSW, LCSW Clinical Social Work [phone] 

## 2017-04-27 MED ORDER — IBUPROFEN 600 MG PO TABS: 600.0000 mg | ORAL_TABLET | Freq: Four times a day (QID) | ORAL | 1 refills | Status: DC | PRN

## 2017-04-27 MED ORDER — PRENATAL MULTIVITAMIN CH: 1.0000 | ORAL_TABLET | Freq: Every day | ORAL | 3 refills | Status: DC

## 2017-04-27 NOTE — Progress Notes (Signed)
Post Partum Day 2 Subjective: no complaints, up ad lib, voiding, tolerating PO and nl lochia, pain controlled  Objective: Blood pressure 100/70, pulse (!) 52, temperature 98.2 F (36.8 C), temperature source Oral, resp. rate 16, height 5\' 9"  (1.753 m), weight 73.8 kg (162 lb 9.6 oz), last menstrual period 07/07/2016, SpO2 100 %, unknown if currently breastfeeding.  Physical Exam:  General: alert and no distress Lochia: appropriate Uterine Fundus: firm   Recent Labs    04/25/17 0724 04/26/17 0615  HGB 12.9 11.3*  HCT 37.0 32.3*    Assessment/Plan: Discharge home, Breastfeeding and Lactation consult.  Routine PP care.  D/c with motrin and PNV.  F/u 6 weeks.     LOS: 2 days   Juwuan Sedita Bovard-Stuckert 04/27/2017, 8:27 AM

## 2017-04-27 NOTE — Lactation Note (Signed)
This note was copied from a baby's chart. Lactation Consultation Note  Patient Name: Anna [name] JZPHX'T Date: 04/27/2017 Reason for consult: Follow-up assessment;Infant weight loss;Term(6% weight loss , @ 31 hours 5.1 Bili )  Baby is 25 hours old  LC reviewed and updated doc flow sheets - per mom  Mom denies soreness, sore nipple and engorgement and tx. Reviewed.  Mom has hand pump and a DEBP at home.  Mother informed of post-discharge support and given phone number to the lactation department, including services for phone call assistance; out-patient appointments; and breastfeeding support group. List of other breastfeeding resources in the community given in the handout. Encouraged mother to call for problems or concerns related to breastfeeding.    Maternal Data    Feeding Feeding Type: (last fed at 9:10 for 10 mins ) Length of feed: 10 min(per mom )  LATCH Score - ( Latch score by RN )  Latch: Grasps breast easily, tongue down, lips flanged, rhythmical sucking.  Audible Swallowing: Spontaneous and intermittent  Type of Nipple: Everted at rest and after stimulation  Comfort (Breast/Nipple): Soft / non-tender  Hold (Positioning): No assistance needed to correctly position infant at breast.  LATCH Score: 10  Interventions Interventions: Breast feeding basics reviewed  Lactation Tools Discussed/Used     Consult Status Consult Status: Complete Date: 04/27/17    Myer Haff 04/27/2017, 9:59 AM

## 2017-04-27 NOTE — Discharge Summary (Signed)
OB Discharge Summary     Patient Name: Anna [name] DOB: February 18, 1990 MRN: [id number]  Date of admission: 04/25/2017 Delivering MD: Janyth Contes   Date of discharge: 04/27/2017  Admitting diagnosis: INDUCTION Intrauterine pregnancy: [redacted]w[redacted]d     Secondary diagnosis:  Principal Problem:   SVD (spontaneous vaginal delivery) Active Problems:   Normal pregnancy in multigravida in third trimester  Additional problems: N/A     Discharge diagnosis: Term Pregnancy Delivered                                                                                                Post partum procedures:N/A  Augmentation: AROM and Pitocin  Complications: None  Hospital course:  Induction of Labor With Vaginal Delivery   27 y.o. yo G2P2002 at [redacted]w[redacted]d was admitted to the hospital 04/25/2017 for induction of labor.  Indication for induction: Favorable cervix at term.  Patient had an uncomplicated labor course as follows: Membrane Rupture Time/Date: 8:45 AM ,04/25/2017   Intrapartum Procedures: Episiotomy: None [1]                                         Lacerations:  2nd degree [3]  Patient had delivery of a Viable infant.  Information for the patient's newborn:  Treniece, [place] [[id number]]  Delivery Method: Vaginal, Spontaneous(Filed from Delivery Summary)   04/25/2017  Details of delivery can be found in separate delivery note.  Patient had a routine postpartum course. Patient is discharged home 04/27/17.  Physical exam  Vitals:   04/26/17 0006 04/26/17 0750 04/26/17 1710 04/27/17 0627  BP: 116/69 99/65 101/70 100/70  Pulse: (!) 55 (!) 55 62 (!) 52  Resp: 16 18 16 16   Temp: 98.4 F (36.9 C) 97.9 F (36.6 C) 98.4 F (36.9 C) 98.2 F (36.8 C)  TempSrc: Oral Oral Oral Oral  SpO2:    100%  Weight:      Height:       General: alert and no distress Lochia: appropriate Uterine Fundus: firm  Labs: Lab Results  Component Value Date   WBC 6.0 04/26/2017   HGB 11.3 (L) 04/26/2017   HCT 32.3  (L) 04/26/2017   MCV 95.3 04/26/2017   PLT 140 (L) 04/26/2017   CMP Latest Ref Rng & Units 07/23/2014  Glucose 70 - 99 mg/dL 79  BUN 6 - 23 mg/dL 7  Creatinine 0.50 - 1.10 mg/dL 0.77  Sodium 135 - 145 mEq/L 142  Potassium 3.5 - 5.3 mEq/L 3.7  Chloride 96 - 112 mEq/L 106  CO2 19 - 32 mEq/L 26  Calcium 8.4 - 10.5 mg/dL 9.2  Total Protein 6.0 - 8.3 g/dL 6.7  Total Bilirubin 0.2 - 1.2 mg/dL 0.5  Alkaline Phos 39 - 117 U/L 18(L)  AST 0 - 37 U/L 15  ALT 0 - 35 U/L <8    Discharge instruction: per After Visit Summary and "Baby and Me Booklet".  After visit meds:  Allergies as of 04/27/2017   No Known Allergies  Medication List    STOP taking these medications   promethazine 25 MG tablet Commonly known as:  PHENERGAN   valACYclovir 1000 MG tablet Commonly known as:  VALTREX     TAKE these medications   ibuprofen 600 MG tablet Commonly known as:  ADVIL,MOTRIN Take 1 tablet (600 mg total) by mouth every 6 (six) hours as needed.   lansoprazole 15 MG capsule Commonly known as:  PREVACID Take 15 mg by mouth daily.   prenatal multivitamin Tabs tablet Take 1 tablet by mouth at bedtime.   PROCTOSOL HC 2.5 % rectal cream Generic drug:  hydrocortisone Place 1 application rectally 3 (three) times daily as needed.       Diet: routine diet  Activity: Advance as tolerated. Pelvic rest for 6 weeks.   Outpatient follow up:6 weeks Follow up Appt:No future appointments. Follow up Visit:No follow-ups on file.  Postpartum contraception: Undecided  Newborn Data: Live born female  Birth Weight: 7 lb 7.8 oz (3395 g) APGAR: 74, 9  Newborn Delivery   Birth date/time:  04/25/2017 16:41:00 Delivery type:  Vaginal, Spontaneous     Baby Feeding: Breast Disposition:home with mother   04/27/2017 Janyth Contes, MD

## 2017-04-29 ENCOUNTER — Inpatient Hospital Stay (HOSPITAL_COMMUNITY)
Admission: AD | Admit: 2017-04-29 | Payer: BLUE CROSS/BLUE SHIELD | Source: Ambulatory Visit | Admitting: Obstetrics and Gynecology

## 2017-05-23 DIAGNOSIS — Z01110 Encounter for hearing examination following failed hearing screening: Secondary | ICD-10-CM | POA: Diagnosis not present

## 2017-05-23 DIAGNOSIS — R94120 Abnormal auditory function study: Secondary | ICD-10-CM | POA: Diagnosis not present

## 2017-06-05 DIAGNOSIS — Z3009 Encounter for other general counseling and advice on contraception: Secondary | ICD-10-CM | POA: Diagnosis not present

## 2017-06-05 DIAGNOSIS — Z1389 Encounter for screening for other disorder: Secondary | ICD-10-CM | POA: Diagnosis not present

## 2017-07-09 DIAGNOSIS — F5089 Other specified eating disorder: Secondary | ICD-10-CM | POA: Diagnosis not present

## 2017-07-16 DIAGNOSIS — F5089 Other specified eating disorder: Secondary | ICD-10-CM | POA: Diagnosis not present

## 2017-07-23 DIAGNOSIS — F5089 Other specified eating disorder: Secondary | ICD-10-CM | POA: Diagnosis not present

## 2017-08-20 DIAGNOSIS — F5089 Other specified eating disorder: Secondary | ICD-10-CM | POA: Diagnosis not present

## 2017-10-03 ENCOUNTER — Other Ambulatory Visit (HOSPITAL_COMMUNITY): Payer: Self-pay | Admitting: Family Medicine

## 2017-10-03 ENCOUNTER — Ambulatory Visit (HOSPITAL_COMMUNITY)
Admission: RE | Admit: 2017-10-03 | Discharge: 2017-10-03 | Disposition: A | Discharge status: Home / Self Care | Payer: BLUE CROSS/BLUE SHIELD | Source: Ambulatory Visit | Attending: Family Medicine | Admitting: Family Medicine

## 2017-10-03 DIAGNOSIS — R1031 Right lower quadrant pain: Secondary | ICD-10-CM | POA: Diagnosis not present

## 2017-10-03 DIAGNOSIS — R630 Anorexia: Secondary | ICD-10-CM | POA: Diagnosis not present

## 2017-10-03 DIAGNOSIS — R110 Nausea: Secondary | ICD-10-CM | POA: Diagnosis not present

## 2017-10-03 MED ORDER — IOPAMIDOL (ISOVUE-300) INJECTION 61%
100.0000 mL | Freq: Once | INTRAVENOUS | Status: AC | PRN
  Administered 2017-10-03: 100 mL via INTRAVENOUS

## 2017-10-03 MED ORDER — IOPAMIDOL (ISOVUE-300) INJECTION 61%
INTRAVENOUS | Status: AC
  Filled 2017-10-03: qty 30

## 2017-10-03 MED ORDER — IOPAMIDOL (ISOVUE-300) INJECTION 61%
INTRAVENOUS | Status: AC
  Filled 2017-10-03: qty 100

## 2017-10-03 MED ORDER — SODIUM CHLORIDE 0.9 % IJ SOLN
INTRAMUSCULAR | Status: AC
  Filled 2017-10-03: qty 50

## 2017-10-03 MED ORDER — IOPAMIDOL (ISOVUE-300) INJECTION 61%
30.0000 mL | Freq: Once | INTRAVENOUS | Status: AC | PRN
  Administered 2017-10-03: 30 mL via ORAL

## 2017-10-09 ENCOUNTER — Other Ambulatory Visit: Payer: Self-pay | Admitting: Family Medicine

## 2017-10-09 DIAGNOSIS — R109 Unspecified abdominal pain: Secondary | ICD-10-CM

## 2017-10-17 ENCOUNTER — Other Ambulatory Visit: Payer: BLUE CROSS/BLUE SHIELD

## 2017-10-25 DIAGNOSIS — Z130 Encounter for screening for diseases of the blood and blood-forming organs and certain disorders involving the immune mechanism: Secondary | ICD-10-CM | POA: Diagnosis not present

## 2017-10-25 DIAGNOSIS — Z01419 Encounter for gynecological examination (general) (routine) without abnormal findings: Secondary | ICD-10-CM | POA: Diagnosis not present

## 2017-10-25 DIAGNOSIS — Z6820 Body mass index (BMI) 20.0-20.9, adult: Secondary | ICD-10-CM | POA: Diagnosis not present

## 2017-10-25 DIAGNOSIS — Z1389 Encounter for screening for other disorder: Secondary | ICD-10-CM | POA: Diagnosis not present

## 2017-10-26 DIAGNOSIS — Z124 Encounter for screening for malignant neoplasm of cervix: Secondary | ICD-10-CM | POA: Diagnosis not present

## 2018-01-07 DIAGNOSIS — J3489 Other specified disorders of nose and nasal sinuses: Secondary | ICD-10-CM | POA: Diagnosis not present

## 2018-01-07 DIAGNOSIS — J029 Acute pharyngitis, unspecified: Secondary | ICD-10-CM | POA: Diagnosis not present

## 2018-01-07 DIAGNOSIS — Z23 Encounter for immunization: Secondary | ICD-10-CM | POA: Diagnosis not present

## 2018-09-04 ENCOUNTER — Other Ambulatory Visit: Payer: Self-pay | Admitting: *Deleted

## 2018-09-04 DIAGNOSIS — Z20822 Contact with and (suspected) exposure to covid-19: Secondary | ICD-10-CM

## 2018-09-05 LAB — NOVEL CORONAVIRUS, NAA: SARS-CoV-2, NAA: NOT DETECTED

## 2018-12-09 ENCOUNTER — Ambulatory Visit (HOSPITAL_COMMUNITY): Payer: Commercial Managed Care - PPO | Attending: Obstetrics and Gynecology | Admitting: Genetic Counselor

## 2018-12-09 ENCOUNTER — Ambulatory Visit (HOSPITAL_COMMUNITY): Payer: Self-pay | Admitting: Genetic Counselor

## 2018-12-09 ENCOUNTER — Ambulatory Visit (HOSPITAL_COMMUNITY): Payer: Commercial Managed Care - PPO

## 2018-12-09 ENCOUNTER — Other Ambulatory Visit: Payer: Self-pay

## 2018-12-09 DIAGNOSIS — Z3169 Encounter for other general counseling and advice on procreation: Secondary | ICD-10-CM

## 2018-12-09 DIAGNOSIS — Z8489 Family history of other specified conditions: Secondary | ICD-10-CM | POA: Diagnosis not present

## 2018-12-09 DIAGNOSIS — Z315 Encounter for genetic counseling: Secondary | ICD-10-CM | POA: Diagnosis not present

## 2018-12-09 DIAGNOSIS — Z31430 Encounter of female for testing for genetic disease carrier status for procreative management: Secondary | ICD-10-CM

## 2018-12-09 NOTE — Progress Notes (Signed)
12/09/2018  MARLAYNA SINEX Oct 14, 1990 MRN: [id number] DOV: 12/09/2018  Ms. Dolbow presented to the [facility] for a genetics consultation regarding her history of a previous child with hypophosphatasia. Ms. Laino was accompanied to her appointment by her husband, Glorianne Manchester.   Indication for genetic counseling - Previous child with hypophosphatasia  Prenatal history  Ms. Vint presented for a preconception consultation; thus, prenatal history was not reviewed.   Family History  A three generation pedigree was drafted and reviewed. The family history is remarkable for the following:  - The couple's 25 year old daughter Adriana Mccallum was recently diagnosed with hypophosphatasia due to pathogenic variants in the ALPL gene. Emrie began her diagnostic odyssey when she started losing her teeth at 20 months of age. She now has tooth decay and an abnormal gait. See Discussion section for more details.  - Ms. Vogl reports a personal history of Raynaud's. She had a history of broken bones as a child (leg, wrist, and arm) and also began losing her teeth early (<29 years of age). Ms. Holdt sister has scoliosis and a history of numerous dental cavities (8 recently identified at the age of 64). Ms. Lord mother has osteopenia, joint pain that began in her teens, and a history of dental cavities. Her twin sister also has osteopenia, spinal compression fractures s/p kyphoplasty, and a "porous jaw". Ms. Hacker has two other maternal aunts who have osteopenia, one of whom has also broken an ankle after a fall. Ms. Aftab maternal uncle has a daughter with a history of several broken bones (ankles and toes) throughout childhood and adulthood. Ms. Jenkins maternal grandmother has a history of osteoporosis diagnosed at the age of 28 and reportedly "breaks her back every few months". We discussed that this family history may be related to hypophosphatasia. It is recommended that Ms. Alejandro undergo carrier screening for  hypophosphatasia to help determine risks for relatives. See Discussion section for more details.  - Mr. McLeroy has a daughter from a prior relationship with Legg-Calve-Perthes disease that required surgery at 30 years of age. Legg-Calve-Perthes disease is a childhood condition that occurs when blood supply to the femoral head of the hip joint is interrupted and the bone dies, fractures, and begins to renew itself. The cause of Legg-Calve-Perthes disease is unknown. Thus, risk assessment is limited.  -Mr. McLeroy's aforementioned daughter has a daughter with spina bifida. We discussed that many forms of spina bifida are multifactorial in nature. Approximately 5-10% of individuals with open neural tube defects (ONTDs) such as spina bifida have an underlying chromosome condition; however, individuals with these conditions often have other features in addition to spina bifida. If this relative's spina bifida is multifactorial in nature, there is a 0.5-1% recurrence risk for the couple's children.   - Mr. McLeroy has a paternal half sister whose son was born with a cleft palate. Cleft lip/palate (CL/P) is most often an isolated condition, but can be present in combination with other birth defects possibly as part of a genetic syndrome. CL/P occurs in 0.1-0.2% of the general population; isolated cleft palate occurs in 0.04% of the general population. We discussed that if this relative's cleft palate is isolated, the risk of recurrence in this couple's children may be slightly increased. Detailed anatomy ultrasounds are often able to detect CL/P prenatally.  - Mr. McLeroy has a maternal aunt who had breast cancer around 49 years of age, a maternal grandmother who died of ovarian cancer at age 36,  and a maternal great grandfather who died of colon cancer at the age of 63. His paternal grandfather was also diagnosed with colon cancer in his 47s but lived to be 29. We discussed that while most cancers are thought to  be sporadic or due to environmental factors, some families appear to have a strong predisposition to cancers. When considering a family history of cancer, we look for common types of cancer in multiple family members occurring at younger than typical ages. We discussed the option of meeting with a cancer genetic counselor to discuss any possible screening or testing options available. If Mr. Gilman Schmidt is concerned about the family history of cancer and would like to learn more about the family's chance for an inherited cancer syndrome, his healthcare provider may refer him or his relatives to the [facility] [phone]).   The remaining family histories were reviewed and found to be noncontributory for birth defects, intellectual disability, recurrent pregnancy loss, and known genetic conditions.    Ms. Sellards ethnicity is [place] and [place]. Mr. Gennette Pac ethnicity is [place]. Ashkenazi Jewish ancestry and consanguinity were denied. Pedigree will be scanned under Media.  Discussion  Ms. Jaquess was referred for genetic counseling as she has a daughter named Emrie who was recently diagnosed with hypophosphatasia. Emrie has an appointment scheduled with Dr. Lonell Grandchild at [facility] pediatric genetics on 12/14; however, Ms. Pulse requested that her OBGYN provider refer her for a preconception consultation to discuss the condition and coordinate familial variant testing.  Hypophosphatasia (HPP), or "soft bone disease", is a condition that affects the development of bones and teeth. There are several forms of HPP. The severe perinatal form is characterized by prenatally diagnosed skeletal malformations, respiratory insufficiency, and hypercalcemia. The benign perinatal form is characterized by prenatal skeletal manifestations that resolve, with postnatal features similar to the milder forms of the disorder. The infantile form is characterized by rickets with an onset between birth and 34 months of  age. Other features may include short limbs, an abnormally shaped chest, soft skull bones, poor feeding, failure to gain weight, respiratory problems, and hypercalcemia marked by recurrent vomiting and kidney problems. The childhood/juvenile form ranges from low bone mineral density with unexplained fractures to rickets and premature loss of primary (baby) teeth. Other variable features may include short stature, bowed legs, knock knees, enlarged wrist and ankle joints, abnormal skull shape, delayed walking, abnormal gait, and bone/joint pain. The adult form is characterized by stress fractures in the lower extremities in middle age and is sometimes accompanied by premature loss of adult teeth. Adults may also experience osteomalacia (a softening of the bones), chronic joint pain, and inflammation. Odontohypophosphatasia is a different form of HPP, characterized only by premature tooth loss and/or severe dental cavities with no skeletal manifestations. Symptoms and severity of HPP vary widely between affected individuals.  HPP is caused by pathogenic variants in the ALPL gene. This gene encodes for an enzyme called tissue-nonspecific alkaline phosphatase (TNSALP). TNSALP plays a role in mineralization, which is the process in which minerals such as calcium and phosphorus are deposited into developing bones and teeth. Mineralization is critical for the formation of bones that are strong and rigid as well as teeth that can withstand chewing and grinding. Pathogenic variants in ALPL cause low levels of TNSALP to be produced, leading to defective mineralization of the bones and teeth. Impaired phosphate and calcium regulation can also lead to progressive damage to multiple vital organs. As a result, some affected individuals  may experience bone deformities, profound muscle weakness, seizures, impaired kidney function, and respiratory failure.   HPP can be treated with an enzyme replacement therapy (ERT) called  asfotase alfa, AKA Strensiq. Strensiq replaces the deficient TNSALP enzyme in the body. Early studies have demonstrated improvements in bone mineralization, respiratory function, and survival in individuals with perinatal/infantile forms of HPP, improvements in gait in individuals with the juvenile form, and improvements in gross motor function and muscle strength in adolescents and adults with HPP. We reviewed that research about the effects of ERT for HPP is still ongoing. Strensiq is administered via subcutaneous injection (under the skin) and is required throughout an affected individual's life. Other treatment strategies for individuals with HPP are focused on an individual's symptoms. Twice yearly dental visits beginning at the age of 1 year are also recommended. We discussed that the couple's daughter will be managed by a multidisciplinary team, including genetics, endocrinology, nephrology, neurology, orthopedics, dentistry, pain management specialists, and physical/occupational therapy. The couple had excellent questions about treatments, including questions about dosage and the process of administering ERT, additional supplements they could try, and how to get referrals to specialists familiar with HPP. I encouraged them to ask these questions at their appointment with genetics at [facility], as the providers there are experts in treating this condition.  The couple was counseled that HPP can be inherited in either an autosomal dominant or autosomal recessive fashion. The severe perinatal and infantile forms are most commonly inherited in an autosomal recessive fashion, in which both parents are most often carriers. Carriers may appear asymptomatic or may manifest milder symptoms of the condition. Although both parents are typically carriers, there have been reports of affected individuals inheriting one pathogenic variant from a parent and having another de novo (new) pathogenic variant in the ALPL gene. The  milder forms of HPP, including the juvenile and adult forms, can be inherited in either an autosomal dominant or autosomal recessive fashion. All affected individuals with the dominant form of HPP inherit a pathogenic ALPL variant from their parent; de novo variants have not been described for autosomal dominant cases.  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 If both Ms. Vaccaro and Mr. McLeroy are carriers, they would have a 1 in 4 (25%) chance of having an affected child, a 1 in 2 (50%) chance of having a child who is a carrier for the condition, and a 1 in 4 (25%) chance of having an unaffected child who is not a carrier with each future pregnancy. If only one parent was identified to be a carrier, it is possible that Emrie's other variant was de novo, occurring for the first time in her. If this is the case, risk of recurrence for the couple's future pregnancies would be low. However, de novo pathogenic variants are extremely rare. If both Ms. Castelli and Mr. McLeroy are identified to be carriers, it is recommended that the couple inform their relatives about their carrier status to determine who else is at risk of being a carrier for HPP. It is also recommended that Emrie be referred for genetic counseling when she is considering having a family of her own someday so that her partner can undergo carrier screening for HPP. This would clarify her own personal risks for having an affected child.  We  reviewed that there are several options to determine theHPP status of a pregnancy, either prior to conception or during pregnancy. Firstly, preimplantation genetic testing for monogenic/single gene conditions (PGT-M)includingHPPis possible. PGT-M utilizes in vitro fertilization, with genetic testing for the  familial ALPL gene variants performed prior to embryoimplantation. Embryos that are unaffected are then transferred to the uterus for implantation. This technology is relatively new, and thus the process can be costly.Insurance oftentimes does not cover the cost of the procedure.  The couplewas also counseled about the option of conceiving naturally then testing the pregnancy for fetal HPPstatus. We reviewed that diagnostic testing viachorionic villus sampling (CVS)between 11-14 weeks'gestationoramniocentesisany time after 16 weeks'gestationwould be available to determine whether a fetus hasHPP prenatally. We discussed the technical aspects of each procedure and quoted up to a 1 in 500 (0.2%) risk for spontaneous pregnancy loss or other adverse pregnancy outcomes as a result of either procedure. Cells from either a placental or amniotic fluid sample allow fordirect genetic testing of the familial variants in the ALPL gene. Diagnostic testing is the only way to determine if a child will be affected by the condition prenatally.  I provided the couple with a variety of resources, including information about HPP from MedlinePlus (formerly [employer]), Soft Bones AKA the [place] Hypophosphatasia Foundation, and The [employer]. [employer] B[employer] provides educational information about HPP and offers opportunities for families to connect with others that are also affected by HPP. The [employer] is a resource started by a child with HPP that provides emotional and mental support to pediatric patients that are undergoing ERT for HPP.  Emrie had her genetic testing performed through the laboratory [facility]. [facility] offers free familial variant testing for first-degree relatives of individuals diagnosed through one of the lab's tests. Ms. Hamann and Mr. McLeroy had their blood drawn for the free familial variant testing of the ALPL gene today. Results will likely take 2-3 weeks to be returned. I  will call the couple when results become available. We will discuss precise recurrence risks and implications for other relatives in more detail at that time.  I counseled Ms. Lightsey regarding the above risks and available options. The approximate face-to-face time with the genetic counselor was 60 minutes.  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 We will follow results  Reviewed features and treatments associated with hypophosphatasia  Will learn more about this during appointment with [facility] pediatric genetics  Discussed possible recurrence risks for future pregnancies  25% recurrence risk if both parents identified to be carriers  Discussed testing options for future pregnancies  PGT-M vs. diagnostic testing  Reviewed family history concerns  Will discuss precise risks for relatives upon return of testing results   Buelah Manis, [place] Counselor

## 2018-12-09 NOTE — Progress Notes (Signed)
Blood drawn for [facility] familial variant testing in the ALPL gene (daughter with hypophosphatasia).

## 2019-01-07 ENCOUNTER — Telehealth (HOSPITAL_COMMUNITY): Payer: Self-pay | Admitting: Genetic Counselor

## 2019-01-07 NOTE — Telephone Encounter (Addendum)
I called Ms. Gandolfo to discuss her and her husband Herbie Baltimore McLeroy's carrier screening results for hypophosphatasia (HPP). See Genetic Counseling note from 12/09/18 for more details.  Ms. Yano and Mr. McLeroy were each identified to carry one of the two pathogenic ALPL gene variants identified in their daughter, Adriana Mccallum. 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 The ALPL gene variant that Mr. McLeroy carries (c.526G>A) is known to be associated with the autosomal recessive form of HPP. It has been identified in conjunction with other pathogenic ALPL variants in individuals affected with HPP. The ALPL gene variant that Ms. Pavlicek carries (c.1250A>G) is known to be associated with both the autosomal dominant and autosomal recessive forms of the condition. Thus, carriers of the c.1250A>G variant may display symptoms of HPP.  We discussed the implications of these results for the couple's family members. It is likely that Ms. Delpriore's personal medical history and extensive family history are related to the autosomal dominant form of HPP. Each of Ms. Benbrook's first degree relatives (children and parents) have a 1 in 2 (50%) chance of carrying the c.1250A>G variant. While this variant is known to cause HPP features in some individuals, others may carry this variant and not demonstrate any symptoms of the condition. This phenomenon is referred to as reduced penetrance and is likely influenced by other genetic modifiers individuals have. Additionally, Mr. Gennette Pac first degree relatives have a 1 in 2 (50%) chance of carrying the c.526G>A variant and thus being a carrier for autosomal recessive HPP.  We also discussed implications for the couple should they choose to have more children in the future. Given that Ms. Rosten carries a variant that can be expressed in an autosomal dominant or recessive fashion, the couple has a 50% chance of having a child  affected by the autosomal dominant form of HPP with each pregnancy. Since reduced penetrance is possible, it could be difficult to predict how severely affected a child with the autosomal dominant form of the condition may be prenatally. Additionally, since Mr. McLeroy is a carrier for recessive HPP, the couple also has a 1 in 4 (25%) chance of having a child with the autosomal recessive form of HPP with each pregnancy.   Ms. Gohr was informed that her and Mr. McLeroy's blood relatives are eligible to receive free familial variant analysis via the laboratory [facility] from now through the beginning of April. I offered to write family letters for Ms. Montagna and Mr. McLeroy's relatives explaining the genetic findings, potential impacts to them, and the option of free genetic testing. I will include my contact information so that any individual who desires testing may reach out to me and I can facilitate this. Ms. Case indicated that many of her family members will likely be interested in pursuing testing.   Emrie was seen for a consultation with Dr. Lonell Grandchild at [facility] pediatric genetics last month. Per Ms. Gidney, Dr. Lonell Grandchild suspected that Ms. Fugate may have the dominant form of HPP. Dr. Lonell Grandchild requested that Ms. Scherer send her a copy of her genetic testing results when they became available. If she were positive, Dr. Lonell Grandchild wanted to see Ms. Seguin for follow-up. Ms. Remus requested that I forward a copy of her results to Dr. Lonell Grandchild, which I did after our phone call. I encouraged her to reach back out to the care team at [facility] so that she can establish her clinical care with them. Ms. Lamberson  confirmed that she had no further questions at this time and was encouraged to reach out to me should any additional questions arise.  Buelah Manis, MS Genetic Counselor

## 2019-01-13 ENCOUNTER — Other Ambulatory Visit (HOSPITAL_COMMUNITY): Payer: Self-pay | Admitting: Obstetrics

## 2019-01-30 IMAGING — CR DG CHEST 2V
2 series · 2 of 2 positions shown · non-contrast
Comparison: 08/03/2010

CLINICAL DATA: Fever x 2 days, malaise x 3 weeks; no specific chest
complaints; non smoker

EXAM:
CHEST  2 VIEW

[w chest pa]
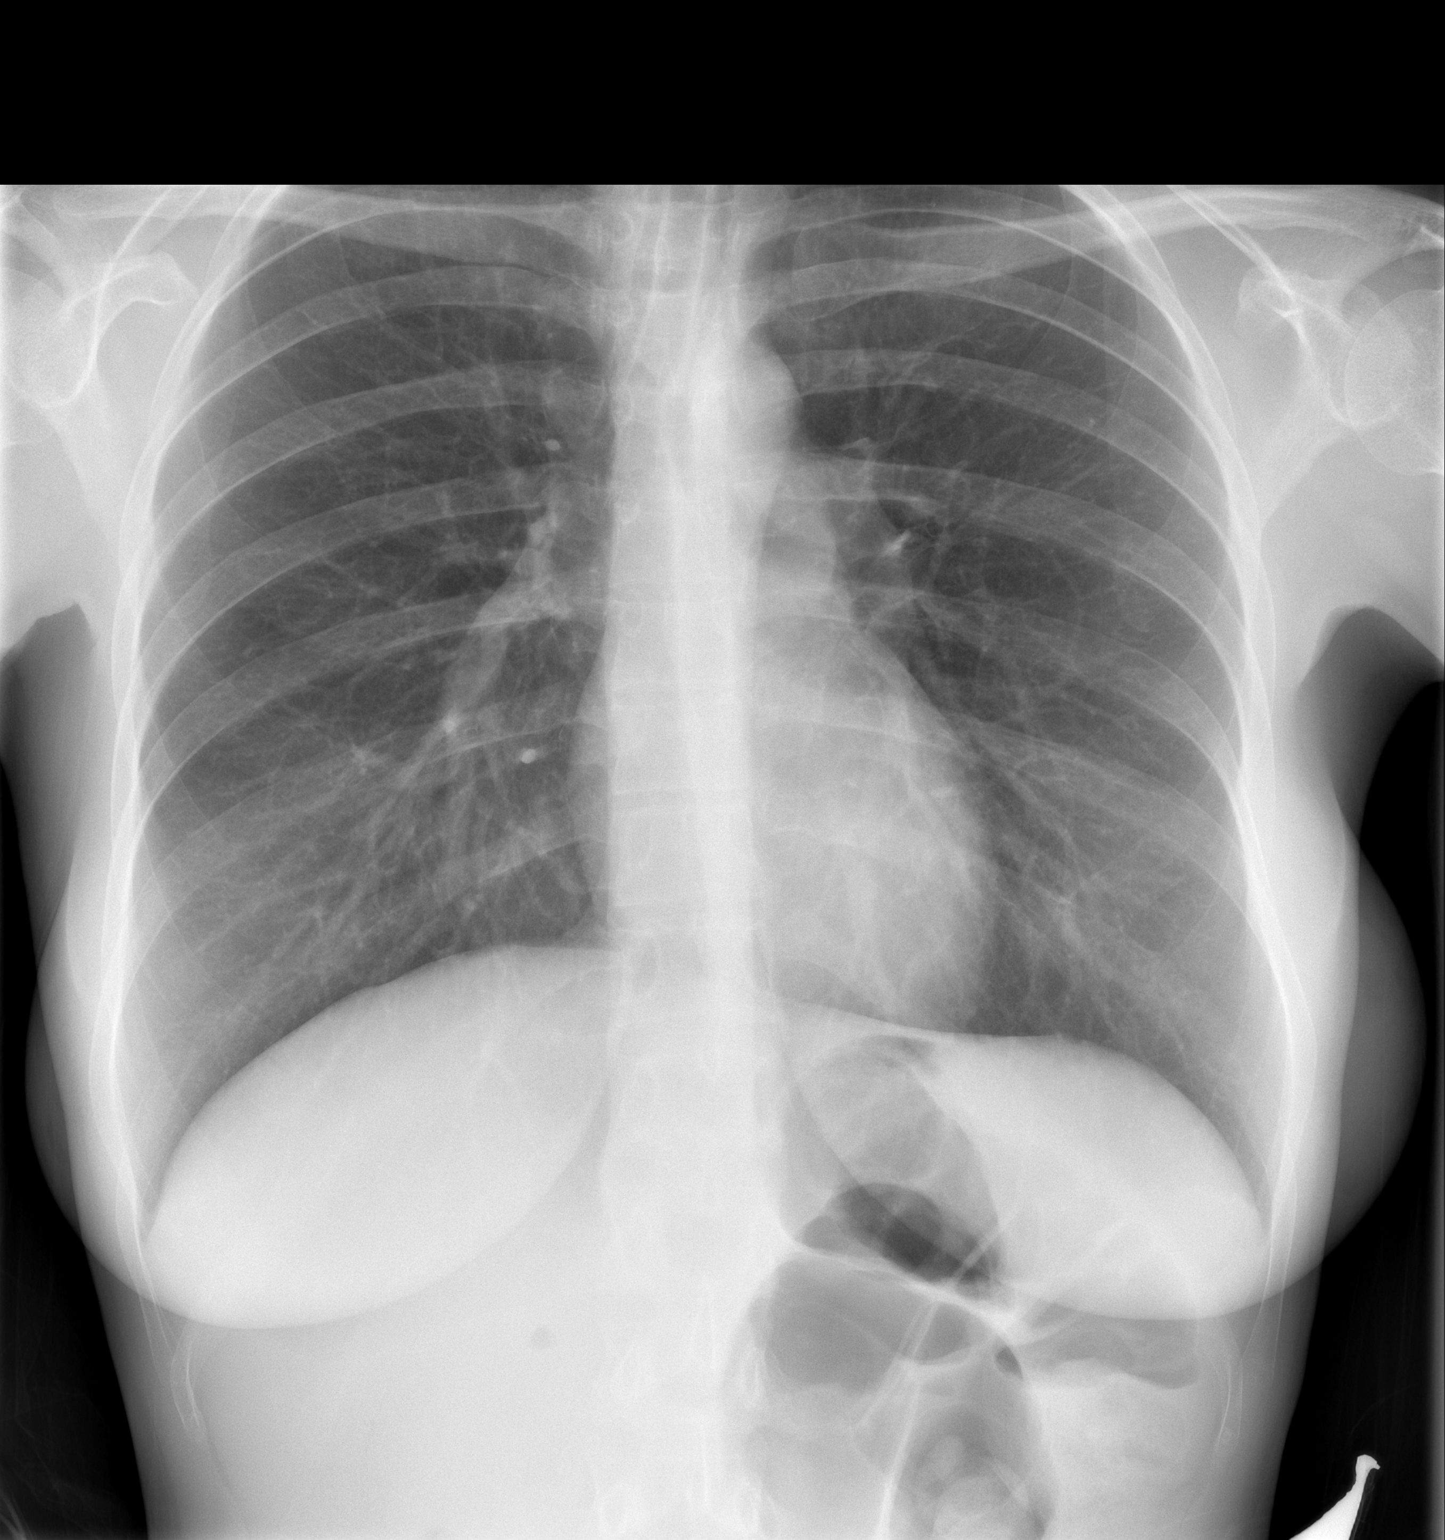

[w chest lat]
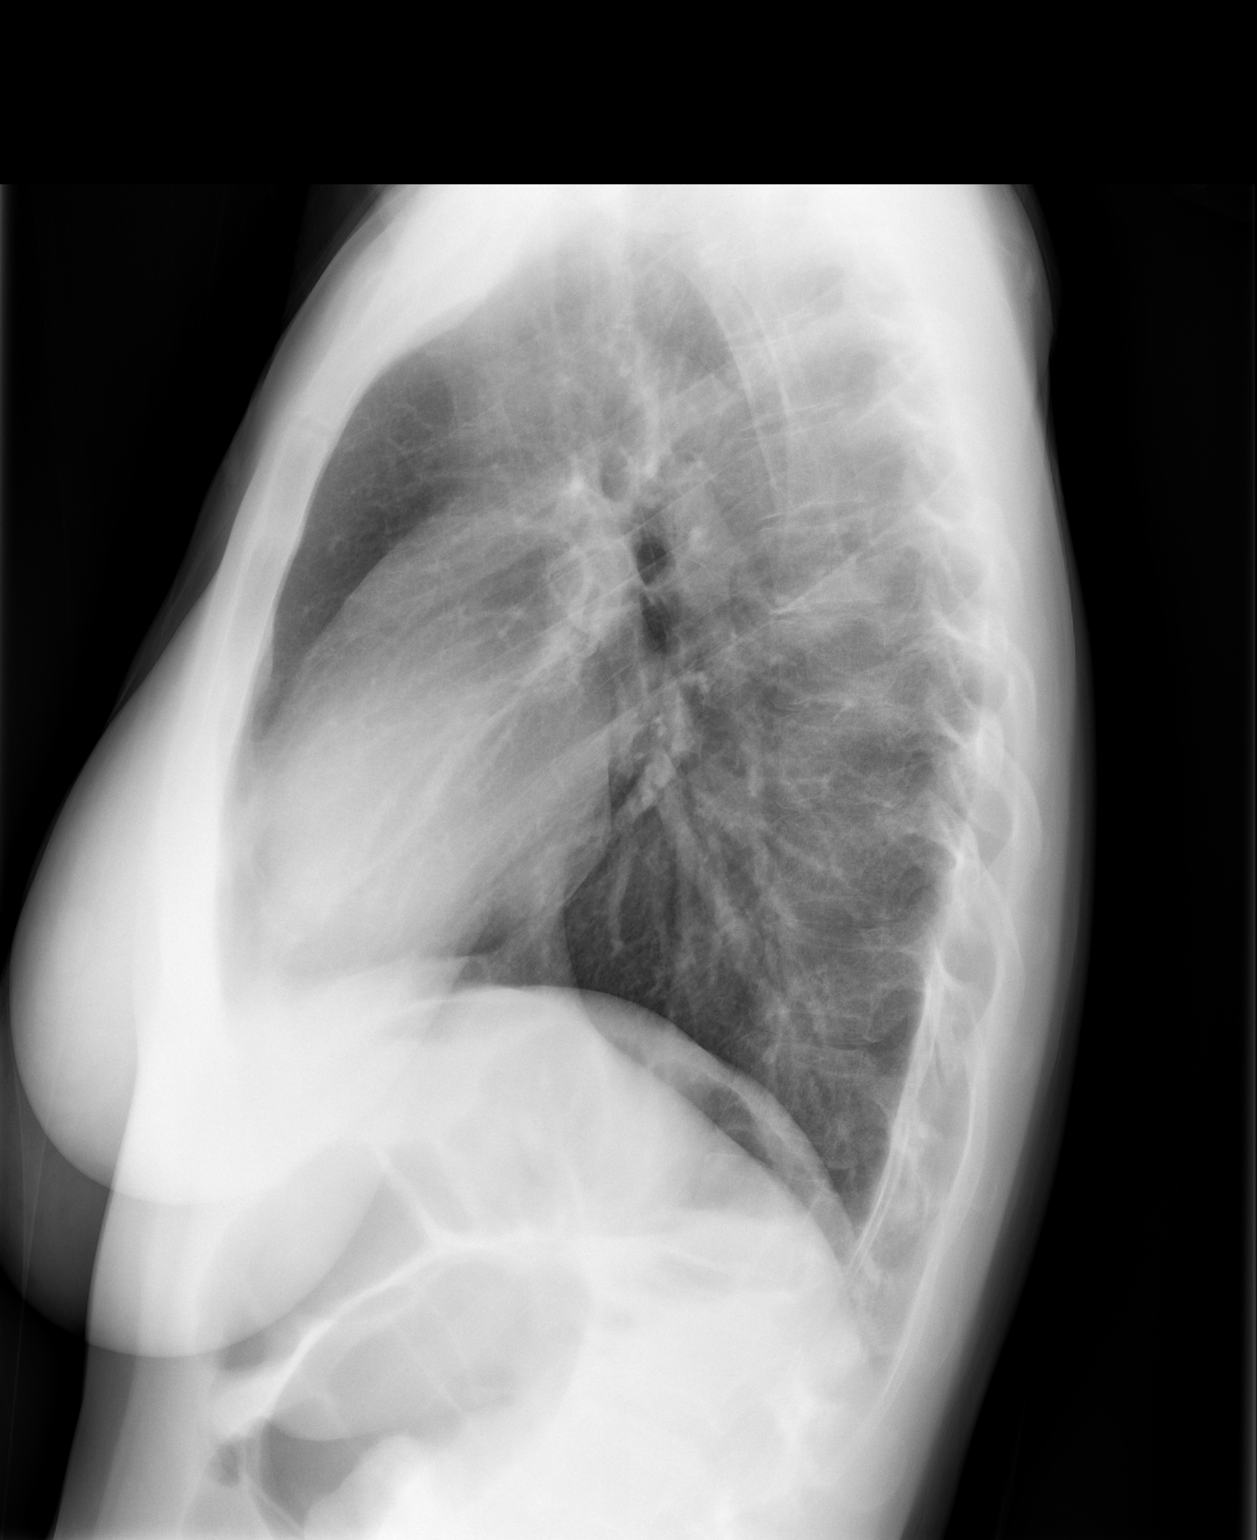

[2 of 2 positions shown; findings below may reference images not displayed]

FINDINGS: The heart size and mediastinal contours are within normal limits.
Both lungs are clear. No pleural effusion or pneumothorax. The
visualized skeletal structures are unremarkable.
IMPRESSION: Normal chest radiographs

## 2019-02-24 ENCOUNTER — Telehealth (HOSPITAL_COMMUNITY): Payer: Self-pay | Admitting: Genetic Counselor

## 2019-02-24 NOTE — Telephone Encounter (Signed)
I called Anna [name] to discuss hypophosphatasia (HPP) testing results for her son, Anna [name]. Testing was ordered as Anna [name] and her partner were each identified to be carriers for pathogenic variants in the ALPL gene associated with HPP. The ALPL gene variant that Anna [name] carries is known to be associated with the autosomal recessive form of HPP, while the ALPL gene variant that Anna [name] carries is known to be associated with both the autosomal dominant and autosomal recessive forms of the condition. Thus, Anna [name] had a 1 in 2 (50%) chance of being affected by autosomal dominant HPP, and a 1 in 4 (25%) chance of being affected by autosomal recessive HPP.  Anna [name] was identified to carry the same ALPL gene variant identified in Anna [name] (c.1250A>G). Given that this ALPL gene change can be associated with the autosomal dominant form of HPP, Anna [name] may display symptoms of the condition. We discussed that it is possible that Anna [name] may be affected by HPP, perhaps mildly as is the case for Anna [name], or he may not demonstrate any signs of the condition at all. 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 For this reason, preconception genetic counseling and HPP carrier screening for his partner is recommended in the future when/if Anna [name] would like to have children of his own.  Given that Anna [name] at [facility] pediatric genetics is following both Anna [name] and Anna [name], she will likely want to see Anna [name] for clinical follow-up as well. Anna [name] granted me permission to share Anna [name] test result with Anna [name] team, which I did after our phone call. Anna [name] confirmed that she had no further questions at this time and was encouraged to reach out to me should any additional questions arise.  Anna Manis, MS Genetic Counselor

## 2019-03-24 ENCOUNTER — Ambulatory Visit (INDEPENDENT_AMBULATORY_CARE_PROVIDER_SITE_OTHER): Payer: Commercial Managed Care - PPO | Admitting: Family Medicine

## 2019-03-24 ENCOUNTER — Encounter: Payer: Self-pay | Admitting: Family Medicine

## 2019-03-24 ENCOUNTER — Other Ambulatory Visit: Payer: Self-pay

## 2019-03-24 ENCOUNTER — Other Ambulatory Visit: Payer: Self-pay | Admitting: General Practice

## 2019-03-24 VITALS — BP 108/68 | HR 60 | Temp 97.7°F | Resp 16 | Ht 69.0 in | Wt 134.5 lb

## 2019-03-24 DIAGNOSIS — I7300 Raynaud's syndrome without gangrene: Secondary | ICD-10-CM

## 2019-03-24 DIAGNOSIS — F339 Major depressive disorder, recurrent, unspecified: Secondary | ICD-10-CM | POA: Diagnosis not present

## 2019-03-24 MED ORDER — FLUOXETINE HCL 20 MG PO TABS: 20.0000 mg | ORAL_TABLET | Freq: Every day | ORAL | 3 refills | Status: DC

## 2019-03-24 MED ORDER — FLUOXETINE HCL 20 MG PO CAPS: 20.0000 mg | ORAL_CAPSULE | Freq: Every day | ORAL | 3 refills | Status: DC

## 2019-03-24 NOTE — Patient Instructions (Signed)
Follow up in 1 month to recheck mood We'll notify you of your lab results and make any changes if needed STOP the Sertraline START the Fluoxetine daily Continue to reach out to those who understand- support groups, parent groups Call with any questions or concerns Hang in there!!  We'll get you through this!!

## 2019-03-24 NOTE — Assessment & Plan Note (Signed)
New to provider, ongoing for pt.  Secondary to her hypophosphatasia.  No ulcerations at this time and not currently on medication but this is something we will follow going forward.

## 2019-03-24 NOTE — Assessment & Plan Note (Signed)
New to provider, ongoing issue for pt.  She is struggling w/ her daughter's recent dx of hypophosphatasia.  She feels guilt that it was inherited, overwhelmed with the appts and insurance, trying to care for her younger child at the same time, and work full time.  Sertraline dose is ineffective and causes sedation.  Will switch to Prozac as she has done well on this in the past.  Will follow closely.  Pt expressed understanding and is in agreement w/ plan.

## 2019-03-24 NOTE — Assessment & Plan Note (Signed)
New to provider and to pt.  She was just dx late last year and is still processing this information.  She is having a harder time w/ her daughter's dx than her own right now.  She is overwhelmed by the medical appts, the insurance issues, her fears about what her daughter will go through going forward.  She has joined online support groups.  Encouraged her to participate.  Will check Alk Phos level today and she will establish care w/ specialty clinic at [facility].  Will follow closely.

## 2019-03-24 NOTE — Progress Notes (Signed)
   Subjective:    Patient ID: Anna [name], female    DOB: 09-Jul-1990, 29 y.o.   MRN: [id number]  HPI New to establish.  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  Has associated 'horrible Raynaud's'.  Has had multiple broken bones but always associated this w/ the sports that she played.  Depression- PHQ=8 today.  Was started on Sertraline 25mg  daily.  Has been on Prozac previously w/ good results.  GYN gave Lexapro which 'made me manic'.  Zoloft 'hasn't been bad' but 'i'm not feeling better'.  Feels less anxious.  Lack of excitement ongoing.  A lot of guilt w/ daughter's dx, not doing well.  Has been in and out of therapy since age 37.  Doesn't feel she has found anyone helpful.   Review of Systems For ROS see HPI   This visit occurred during the SARS-CoV-2 public health emergency.  Safety protocols were in place, including screening questions prior to the visit, additional usage of staff PPE, and extensive cleaning of exam room while observing appropriate contact time as indicated for disinfecting solutions.       Objective:   Physical Exam Vitals reviewed.  Constitutional:      General: She is not in acute distress.    Appearance: Normal appearance. She is well-developed.  HENT:     Head: Normocephalic and atraumatic.  Eyes:     Conjunctiva/sclera: Conjunctivae normal.     Pupils: Pupils are equal, round, and reactive to light.  Neck:     Thyroid: No thyromegaly.  Cardiovascular:     Rate and Rhythm: Normal rate and regular rhythm.     Heart sounds: Normal heart sounds. No murmur.  Pulmonary:     Effort: Pulmonary effort is normal. No respiratory distress.     Breath sounds: Normal breath sounds.  Abdominal:     General: There is no distension.     Palpations: Abdomen is soft.     Tenderness: There is no abdominal tenderness.  Musculoskeletal:     Cervical  back: Normal range of motion and neck supple.  Lymphadenopathy:     Cervical: No cervical adenopathy.  Skin:    General: Skin is warm and dry.  Neurological:     Mental Status: She is alert and oriented to person, place, and time.  Psychiatric:        Behavior: Behavior normal.           Assessment & Plan:

## 2019-03-25 LAB — HEPATIC FUNCTION PANEL
ALT: 5 U/L (ref 0–35)
AST: 14 U/L (ref 0–37)
Albumin: 4.3 g/dL (ref 3.5–5.2)
Alkaline Phosphatase: 11 U/L — ABNORMAL LOW (ref 39–117)
Bilirubin, Direct: 0.1 mg/dL (ref 0.0–0.3)
Total Bilirubin: 0.6 mg/dL (ref 0.2–1.2)
Total Protein: 6.6 g/dL (ref 6.0–8.3)

## 2019-05-15 ENCOUNTER — Telehealth (INDEPENDENT_AMBULATORY_CARE_PROVIDER_SITE_OTHER): Payer: Commercial Managed Care - PPO | Admitting: Family Medicine

## 2019-05-15 ENCOUNTER — Other Ambulatory Visit: Payer: Self-pay

## 2019-05-15 ENCOUNTER — Encounter: Payer: Self-pay | Admitting: Family Medicine

## 2019-05-15 VITALS — BP 111/72 | HR 60 | Ht 69.0 in | Wt 133.0 lb

## 2019-05-15 DIAGNOSIS — B9689 Other specified bacterial agents as the cause of diseases classified elsewhere: Secondary | ICD-10-CM | POA: Diagnosis not present

## 2019-05-15 DIAGNOSIS — F339 Major depressive disorder, recurrent, unspecified: Secondary | ICD-10-CM | POA: Diagnosis not present

## 2019-05-15 DIAGNOSIS — J329 Chronic sinusitis, unspecified: Secondary | ICD-10-CM

## 2019-05-15 MED ORDER — AMOXICILLIN 875 MG PO TABS: 875.0000 mg | ORAL_TABLET | Freq: Two times a day (BID) | ORAL | 0 refills | Status: DC

## 2019-05-15 NOTE — Progress Notes (Signed)
   Virtual Visit via Video   I connected with patient on 05/15/19 at  3:00 PM EDT by a video enabled telemedicine application and verified that I am speaking with the correct person using two identifiers.  Location patient: Home Location provider: [facility], Office Persons participating in the virtual visit: Patient, Provider, Leslie (Jess B)  I discussed the limitations of evaluation and management by telemedicine and the availability of in person appointments. The patient expressed understanding and agreed to proceed.  Subjective:   HPI:   URI- sxs started w/ 'a cold' 1-2 weeks ago.  Sxs initially improved but returned w/ pressure behind the eyes and tooth pain.  Tm 99.5 at GI earlier today.  Minimal cough.  + nasal and head congestion- very little comes out but what does is green.  No ear pain.  No N/V.  + 'bad' headache.  Feels similar to previous sinus infections.  Depression- pt feels that the 20mg  Prozac is working very well and she has not interest in making changes at this time  ROS:   See pertinent positives and negatives per HPI.  Patient Active Problem List   Diagnosis Date Noted  . 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 Secondary Raynaud's 03/24/2019  . Normal pregnancy in multigravida in third trimester 04/25/2017  . SVD (spontaneous vaginal delivery) 04/25/2017  . Normal pregnancy in third trimester 08/14/2015  . Anorexia nervosa 03/26/2012  . Depression, recurrent ([place]) 07/08/2008  . 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 IRRITABLE BOWEL SYNDROME 07/08/2008    Social History   Tobacco Use  . Smoking status: Never Smoker  . Smokeless tobacco: Never Used  Substance Use Topics  . Alcohol use: No    Current Outpatient Medications:  .  BIOTIN PO, Take by mouth., Disp: , Rfl:  .  drospirenone-ethinyl estradiol (NIKKI) 3-0.02 MG tablet, , Disp: , Rfl:  .  FLUoxetine (PROZAC) 20 MG capsule, Take 1 capsule (20 mg total) by mouth daily., Disp: 30 capsule, Rfl: 3 .   valACYclovir (VALTREX) 500 MG tablet, Valtrex 500 mg tablet  Take 1 tablet every day by oral route., Disp: , Rfl:   No Known Allergies  Objective:   BP 111/72   Pulse 60   Ht 5\' 9"  (1.753 m)   Wt 133 lb (60.3 kg)   BMI 19.64 kg/m  AAOx3, NAD NCAT, EOMI No obvious CN deficits Coloring WNL Pt is able to speak clearly, coherently without shortness of breath or increased work of breathing.  Thought process is linear.  Mood is appropriate.   Assessment and Plan:   Bacterial sinusitis- new to provider, recurrent issue for pt.  Start Amoxicillin.  Reviewed supportive care and red flags that should prompt return.  Pt expressed understanding and is in agreement w/ plan.   Depression- currently well controlled on higher dose of Prozac.  No changes at this time.   Annye Asa, MD 05/15/2019

## 2019-05-15 NOTE — Progress Notes (Signed)
I have discussed the procedure for the virtual visit with the patient who has given consent to proceed with assessment and treatment.   Anna [name], CMA     

## 2019-07-20 ENCOUNTER — Other Ambulatory Visit: Payer: Self-pay | Admitting: Family Medicine

## 2019-10-13 ENCOUNTER — Telehealth: Payer: Self-pay | Admitting: Family Medicine

## 2019-10-13 NOTE — Telephone Encounter (Signed)
Pt would need a CPE with PCP.

## 2019-10-13 NOTE — Telephone Encounter (Signed)
Pt should schedule physical exam and we can do all lab work at that time

## 2019-10-13 NOTE — Telephone Encounter (Signed)
Patient would like to have her blood work drawn here that Dr. Lonell Grandchild a genetic counselor has ordered.  Patient will have doctors office fax order to [place].  Please advise.

## 2019-10-13 NOTE — Telephone Encounter (Signed)
Please advise? Should pt have a CPE with you and these labs drawn at that time?

## 2019-10-28 ENCOUNTER — Other Ambulatory Visit: Payer: Self-pay | Admitting: General Practice

## 2019-10-28 MED ORDER — FLUOXETINE HCL 20 MG PO CAPS: 20.0000 mg | ORAL_CAPSULE | Freq: Every day | ORAL | 3 refills | Status: DC

## 2019-10-30 ENCOUNTER — Encounter: Payer: Self-pay | Admitting: Family Medicine

## 2019-10-30 ENCOUNTER — Other Ambulatory Visit: Payer: Self-pay

## 2019-10-30 ENCOUNTER — Ambulatory Visit (INDEPENDENT_AMBULATORY_CARE_PROVIDER_SITE_OTHER): Payer: Commercial Managed Care - PPO | Admitting: Family Medicine

## 2019-10-30 VITALS — BP 105/70 | HR 65 | Temp 97.5°F | Resp 16 | Ht 68.0 in | Wt 136.8 lb

## 2019-10-30 DIAGNOSIS — E7800 Pure hypercholesterolemia, unspecified: Secondary | ICD-10-CM

## 2019-10-30 DIAGNOSIS — Z0000 Encounter for general adult medical examination without abnormal findings: Secondary | ICD-10-CM

## 2019-10-30 DIAGNOSIS — K029 Dental caries, unspecified: Secondary | ICD-10-CM | POA: Insufficient documentation

## 2019-10-30 LAB — CBC WITH DIFFERENTIAL/PLATELET
Basophils Absolute: 0 10*3/uL (ref 0.0–0.1)
Basophils Relative: 1 % (ref 0.0–3.0)
Eosinophils Absolute: 0.1 10*3/uL (ref 0.0–0.7)
Eosinophils Relative: 1.7 % (ref 0.0–5.0)
HCT: 37.6 % (ref 36.0–46.0)
Hemoglobin: 13 g/dL (ref 12.0–15.0)
Lymphocytes Relative: 34.2 % (ref 12.0–46.0)
Lymphs Abs: 1 10*3/uL (ref 0.7–4.0)
MCHC: 34.6 g/dL (ref 30.0–36.0)
MCV: 95.2 fl (ref 78.0–100.0)
Monocytes Absolute: 0.3 10*3/uL (ref 0.1–1.0)
Monocytes Relative: 9.6 % (ref 3.0–12.0)
Neutro Abs: 1.6 10*3/uL (ref 1.4–7.7)
Neutrophils Relative %: 53.5 % (ref 43.0–77.0)
Platelets: 223 10*3/uL (ref 150.0–400.0)
RBC: 3.95 Mil/uL (ref 3.87–5.11)
RDW: 12.2 % (ref 11.5–15.5)
WBC: 3 10*3/uL — ABNORMAL LOW (ref 4.0–10.5)

## 2019-10-30 LAB — LIPID PANEL
Cholesterol: 184 mg/dL (ref 0–200)
HDL: 72.4 mg/dL (ref >39.00)
LDL Cholesterol: 84 mg/dL (ref 0–99)
NonHDL: 111.52
Total CHOL/HDL Ratio: 3
Triglycerides: 140 mg/dL (ref 0.0–149.0)
VLDL: 28 mg/dL (ref 0.0–40.0)

## 2019-10-30 LAB — BASIC METABOLIC PANEL
BUN: 15 mg/dL (ref 6–23)
CO2: 27 mEq/L (ref 19–32)
Calcium: 9.3 mg/dL (ref 8.4–10.5)
Chloride: 103 mEq/L (ref 96–112)
Creatinine, Ser: 0.95 mg/dL (ref 0.40–1.20)
GFR: 79.66 mL/min (ref >60.00)
Glucose, Bld: 67 mg/dL — ABNORMAL LOW (ref 70–99)
Potassium: 4 mEq/L (ref 3.5–5.1)
Sodium: 138 mEq/L (ref 135–145)

## 2019-10-30 LAB — HEPATIC FUNCTION PANEL
ALT: 5 U/L (ref 0–35)
AST: 18 U/L (ref 0–37)
Albumin: 4.3 g/dL (ref 3.5–5.2)
Alkaline Phosphatase: 11 U/L — ABNORMAL LOW (ref 39–117)
Bilirubin, Direct: 0.1 mg/dL (ref 0.0–0.3)
Total Bilirubin: 0.6 mg/dL (ref 0.2–1.2)
Total Protein: 6.5 g/dL (ref 6.0–8.3)

## 2019-10-30 LAB — VITAMIN D 25 HYDROXY (VIT D DEFICIENCY, FRACTURES): VITD: 53.69 ng/mL (ref 30.00–100.00)

## 2019-10-30 LAB — MAGNESIUM: Magnesium: 1.9 mg/dL (ref 1.5–2.5)

## 2019-10-30 LAB — TSH: TSH: 2.04 u[IU]/mL (ref 0.35–4.50)

## 2019-10-30 LAB — PHOSPHORUS: Phosphorus: 3.1 mg/dL (ref 2.3–4.6)

## 2019-10-30 NOTE — Assessment & Plan Note (Signed)
Will order labs that [facility] has requested.

## 2019-10-30 NOTE — Patient Instructions (Addendum)
Follow up in 1 year or as needed We'll notify you of your lab results and make any changes if needed Keep up the good work!  You look great! Call with any questions or concerns Stay Safe!  Stay Healthy! Hang in there!! 

## 2019-10-30 NOTE — Assessment & Plan Note (Signed)
Pt's PE WNL.  UTD on Tdap, COVID, flu shots.  UTD on pap.  Check labs.  Anticipatory guidance provided.

## 2019-10-30 NOTE — Progress Notes (Signed)
° °  Subjective:    Patient ID: Anna [name], female    DOB: 05-18-1990, 29 y.o.   MRN: [id number]  HPI CPE- UTD on pap, COVID vaccines, Tdap, flu.  Pt is being seen at [facility] for hypophosphatasia and they are requesting labs.  Reviewed past medical, surgical, family and social histories.   Health Maintenance  Topic Date Due   Hepatitis C Screening  Never done   PAP SMEAR-Modifier  03/23/2020   TETANUS/TDAP  01/31/2027   INFLUENZA VACCINE  Completed   COVID-19 Vaccine  Completed   HIV Screening  Completed      Review of Systems Patient reports no vision/ hearing changes, adenopathy,fever, weight change,  persistant/recurrent hoarseness , swallowing issues, chest pain, palpitations, edema, persistant/recurrent cough, hemoptysis, dyspnea (rest/exertional/paroxysmal nocturnal), gastrointestinal bleeding (melena, rectal bleeding), abdominal pain, significant heartburn, bowel changes, GU symptoms (dysuria, hematuria, incontinence), Gyn symptoms (abnormal  bleeding, pain),  syncope, focal weakness, memory loss, numbness & tingling, skin/hair/nail changes, abnormal bruising or bleeding, anxiety, or depression.   This visit occurred during the SARS-CoV-2 public health emergency.  Safety protocols were in place, including screening questions prior to the visit, additional usage of staff PPE, and extensive cleaning of exam room while observing appropriate contact time as indicated for disinfecting solutions.       Objective:   Physical Exam General Appearance:    Alert, cooperative, no distress, appears stated age  Head:    Normocephalic, without obvious abnormality, atraumatic  Eyes:    PERRL, conjunctiva/corneas clear, EOM's intact, fundi    benign, both eyes  Ears:    Normal TM's and external ear canals, both ears  Nose:   Deferred due to COVID  Throat:   Neck:   Supple, symmetrical, trachea midline, no adenopathy;    Thyroid: no enlargement/tenderness/nodules  Back:     Symmetric, no  curvature, ROM normal, no CVA tenderness  Lungs:     Clear to auscultation bilaterally, respirations unlabored  Chest Wall:    No tenderness or deformity   Heart:    Regular rate and rhythm, S1 and S2 normal, no murmur, rub   or gallop  Breast Exam:    Deferred to GYN  Abdomen:     Soft, non-tender, bowel sounds active all four quadrants,    no masses, no organomegaly  Genitalia:    Deferred to GYN  Rectal:    Extremities:   Extremities normal, atraumatic, no cyanosis or edema  Pulses:   2+ and symmetric all extremities  Skin:   Skin color, texture, turgor normal, no rashes or lesions  Lymph nodes:   Cervical, supraclavicular, and axillary nodes normal  Neurologic:   CNII-XII intact, normal strength, sensation and reflexes    throughout          Assessment & Plan:

## 2019-10-31 ENCOUNTER — Encounter: Payer: Self-pay | Admitting: Family Medicine

## 2019-10-31 LAB — CALCIUM / CREATININE RATIO, URINE
CALCIUM, RANDOM URINE: 2.5 mg/dL
CALCIUM/CREATININE RATIO: 12 mg/g creat (ref 10–320)
Creatinine, Urine: 204 mg/dL (ref 20–275)

## 2019-10-31 LAB — SODIUM, URINE, RANDOM: Sodium, Ur: 96 mmol/L (ref 28–272)

## 2019-11-06 LAB — AMINO ACID PROFILE, QN, URINE
Alanine Ur: 204.2 umol/g Cr (ref 77.9–1337.0)
Alloisoleucine Ur: 0.4 umol/g Cr (ref 0.1–13.5)
Alpha-aminoadipate Ur: 57.9 umol/g Cr (ref 0.5–146.7)
Alpha-aminobutyrate Ur: 12.2 umol/g Cr (ref 1.0–34.6)
Arginine Ur: 10.8 umol/g Cr (ref 5.0–69.6)
Argininosuccinate Ur: 4 umol/g Cr (ref 0.1–51.2)
Asparagine Ur: 54.7 umol/g Cr (ref 25.4–454.2)
Aspartate: 5.2 umol/g Cr (ref 1.0–86.7)
Beta-alanine Ur: 1 umol/g Cr (ref 1.0–869.8)
Beta-aminoisobutyrate Ur: 76.7 umol/g Cr (ref 0.5–807.9)
Citrulline Ur: 1.5 umol/g Cr (ref 1.0–27.4)
Cystathionine Ur: 3.3 umol/g Cr (ref 0.5–80.8)
Cystine Ur: 27.4 umol/g Cr (ref 0.3–223.8)
Gamma-aminobutyrate Ur: 2.5 umol/g Cr (ref 0.5–13.1)
Glutamate Ur: 15.9 umol/g Cr (ref 5.0–92.4)
Glutamine Ur: 303.6 umol/g Cr (ref 5.0–1756.2)
Glycine Ur: 876.1 umol/g Cr (ref 277.3–7996.9)
Histidine Ur: 332.8 umol/g Cr (ref 106.4–2534.2)
Homocitrulline Ur: 10.9 umol/g Cr (ref 0.5–80.0)
Homocystine Ur: <0.3 umol/g Cr — ABNORMAL LOW (ref 0.3–1.4)
Hydroxylysine Ur: 1.7 umol/g Cr (ref 0.1–37.3)
Hydroxyproline Ur: 0.5 umol/g Cr (ref 0.5–87.9)
Isoleucine Ur: 7.4 umol/g Cr (ref 5.0–48.1)
Leucine Ur: 16 umol/g Cr (ref 5.0–129.1)
Lysine Ur: 30.8 umol/g Cr (ref 15.3–1020.6)
Methionine Ur: 4.1 umol/g Cr (ref 1.0–37.1)
Ornithine Ur: 5 umol/g Cr (ref 5.0–76.3)
Phenylalanine Ur: 28.9 umol/g Cr (ref 5.0–239.0)
Proline Ur: <5 umol/g Cr — ABNORMAL LOW (ref 5.0–168.6)
Sarcosine Ur: 0.7 umol/g Cr (ref 0.5–27.3)
Serine Ur: 224.9 umol/g Cr (ref 98.4–1052.8)
Taurine: 9.5 umol/g Cr — ABNORMAL LOW (ref 24.2–5335.7)
Threonine Ur: 171.3 umol/g Cr (ref 5.0–714.9)
Tryptophan Ur: 60.6 umol/g Cr (ref 1.0–207.5)
Tyrosine Ur: 28.7 umol/g Cr (ref 5.0–388.9)
Valine Ur: 21.2 umol/g Cr (ref 5.0–147.4)

## 2020-02-24 ENCOUNTER — Other Ambulatory Visit: Payer: Self-pay | Admitting: Pediatrics

## 2020-03-04 ENCOUNTER — Other Ambulatory Visit: Payer: Commercial Managed Care - PPO

## 2020-03-04 ENCOUNTER — Other Ambulatory Visit: Payer: Self-pay | Admitting: Family Medicine

## 2020-03-12 ENCOUNTER — Other Ambulatory Visit: Payer: Self-pay | Admitting: Pediatrics

## 2020-03-12 ENCOUNTER — Ambulatory Visit
Admission: RE | Admit: 2020-03-12 | Discharge: 2020-03-12 | Disposition: A | Discharge status: Home / Self Care | Payer: Commercial Managed Care - PPO | Source: Ambulatory Visit | Attending: Pediatrics | Admitting: Pediatrics

## 2020-03-12 ENCOUNTER — Other Ambulatory Visit: Payer: Self-pay

## 2020-03-17 ENCOUNTER — Other Ambulatory Visit: Payer: Commercial Managed Care - PPO

## 2020-06-30 ENCOUNTER — Encounter: Payer: Self-pay | Admitting: *Deleted

## 2020-07-02 ENCOUNTER — Other Ambulatory Visit: Payer: Self-pay | Admitting: Family Medicine

## 2020-07-21 ENCOUNTER — Other Ambulatory Visit: Payer: Commercial Managed Care - PPO

## 2020-08-11 ENCOUNTER — Other Ambulatory Visit: Payer: Commercial Managed Care - PPO

## 2020-08-13 ENCOUNTER — Other Ambulatory Visit: Payer: Commercial Managed Care - PPO

## 2020-11-06 HISTORY — PX: BREAST SURGERY: SHX581

## 2020-11-09 ENCOUNTER — Encounter: Payer: Self-pay | Admitting: Family Medicine

## 2020-11-09 ENCOUNTER — Other Ambulatory Visit: Payer: Self-pay

## 2020-11-09 DIAGNOSIS — F339 Major depressive disorder, recurrent, unspecified: Secondary | ICD-10-CM

## 2020-11-09 MED ORDER — FLUOXETINE HCL 20 MG PO CAPS: 20.0000 mg | ORAL_CAPSULE | Freq: Every day | ORAL | 3 refills | Status: DC

## 2020-11-15 ENCOUNTER — Encounter: Payer: Self-pay | Admitting: Family Medicine

## 2020-11-15 ENCOUNTER — Ambulatory Visit (INDEPENDENT_AMBULATORY_CARE_PROVIDER_SITE_OTHER): Payer: Commercial Managed Care - PPO | Admitting: Family Medicine

## 2020-11-15 VITALS — BP 122/80 | HR 69 | Temp 97.5°F | Resp 19 | Ht 69.0 in | Wt 141.6 lb

## 2020-11-15 DIAGNOSIS — D225 Melanocytic nevi of trunk: Secondary | ICD-10-CM | POA: Diagnosis not present

## 2020-11-15 DIAGNOSIS — F339 Major depressive disorder, recurrent, unspecified: Secondary | ICD-10-CM

## 2020-11-15 DIAGNOSIS — Z1159 Encounter for screening for other viral diseases: Secondary | ICD-10-CM

## 2020-11-15 DIAGNOSIS — Z0000 Encounter for general adult medical examination without abnormal findings: Secondary | ICD-10-CM

## 2020-11-15 LAB — BASIC METABOLIC PANEL
BUN: 19 mg/dL (ref 6–23)
CO2: 26 mEq/L (ref 19–32)
Calcium: 9.5 mg/dL (ref 8.4–10.5)
Chloride: 103 mEq/L (ref 96–112)
Creatinine, Ser: 0.87 mg/dL (ref 0.40–1.20)
GFR: 87.89 mL/min (ref >60.00)
Glucose, Bld: 77 mg/dL (ref 70–99)
Potassium: 4.2 mEq/L (ref 3.5–5.1)
Sodium: 138 mEq/L (ref 135–145)

## 2020-11-15 LAB — LIPID PANEL
Cholesterol: 163 mg/dL (ref 0–200)
HDL: 71.7 mg/dL (ref >39.00)
LDL Cholesterol: 72 mg/dL (ref 0–99)
NonHDL: 91.68
Total CHOL/HDL Ratio: 2
Triglycerides: 99 mg/dL (ref 0.0–149.0)
VLDL: 19.8 mg/dL (ref 0.0–40.0)

## 2020-11-15 LAB — CBC WITH DIFFERENTIAL/PLATELET
Basophils Absolute: 0 10*3/uL (ref 0.0–0.1)
Basophils Relative: 0.9 % (ref 0.0–3.0)
Eosinophils Absolute: 0.1 10*3/uL (ref 0.0–0.7)
Eosinophils Relative: 1.3 % (ref 0.0–5.0)
HCT: 39.3 % (ref 36.0–46.0)
Hemoglobin: 13.2 g/dL (ref 12.0–15.0)
Lymphocytes Relative: 16.6 % (ref 12.0–46.0)
Lymphs Abs: 0.8 10*3/uL (ref 0.7–4.0)
MCHC: 33.5 g/dL (ref 30.0–36.0)
MCV: 98.9 fl (ref 78.0–100.0)
Monocytes Absolute: 0.6 10*3/uL (ref 0.1–1.0)
Monocytes Relative: 11.7 % (ref 3.0–12.0)
Neutro Abs: 3.5 10*3/uL (ref 1.4–7.7)
Neutrophils Relative %: 69.5 % (ref 43.0–77.0)
Platelets: 262 10*3/uL (ref 150.0–400.0)
RBC: 3.97 Mil/uL (ref 3.87–5.11)
RDW: 12.8 % (ref 11.5–15.5)
WBC: 5.1 10*3/uL (ref 4.0–10.5)

## 2020-11-15 LAB — MAGNESIUM: Magnesium: 1.9 mg/dL (ref 1.5–2.5)

## 2020-11-15 LAB — HEPATIC FUNCTION PANEL
ALT: 4 U/L (ref 0–35)
AST: 17 U/L (ref 0–37)
Albumin: 4.4 g/dL (ref 3.5–5.2)
Alkaline Phosphatase: 13 U/L — ABNORMAL LOW (ref 39–117)
Bilirubin, Direct: 0.1 mg/dL (ref 0.0–0.3)
Total Bilirubin: 0.4 mg/dL (ref 0.2–1.2)
Total Protein: 6.9 g/dL (ref 6.0–8.3)

## 2020-11-15 LAB — PHOSPHORUS: Phosphorus: 4 mg/dL (ref 2.3–4.6)

## 2020-11-15 LAB — VITAMIN D 25 HYDROXY (VIT D DEFICIENCY, FRACTURES): VITD: 82.23 ng/mL (ref 30.00–100.00)

## 2020-11-15 LAB — TSH: TSH: 1 u[IU]/mL (ref 0.35–5.50)

## 2020-11-15 MED ORDER — ALPRAZOLAM 0.5 MG PO TABS: ORAL_TABLET | ORAL | 1 refills | Status: DC

## 2020-11-15 NOTE — Assessment & Plan Note (Signed)
Ongoing issue.  Feels depression is well controlled on Fluoxetine but is having panic like episodes 1-2x/mo.  Typically occurring in high stress situations.  Will have physical sxs such as chest tightness, SOB.  These resolve spontaneously but take time.  Pt is now anxious about having anxiety.  Will start low dose Alprazolam as needed.  Pt expressed understanding and is in agreement w/ plan.

## 2020-11-15 NOTE — Patient Instructions (Signed)
Follow up in 1 year or as needed We'll notify you of your lab results and make any changes if needed Keep up the good work!  You look great! Have GYN send me a copy of the pap report Use the Alprazolam as needed for panic type situations.  Start w/ 1/2 tab and increase to 1 tab if needed Call with any questions or concerns Stay Safe!  Stay Healthy! Happy Holidays!!

## 2020-11-15 NOTE — Progress Notes (Signed)
   Subjective:    Patient ID: ENMA MAEDA, female    DOB: 10/06/1990, 30 y.o.   MRN: [id number]  HPI CPE- pap is scheduled for next month.  Will get flu shot at work.  UTD on Tdap  Health Maintenance  Topic Date Due   Hepatitis C Screening  Never done   PAP SMEAR-Modifier  03/23/2020   INFLUENZA VACCINE  08/02/2020   COVID-19 Vaccine (3 - Booster) 01/01/2021 (Originally 04/02/2019)   TETANUS/TDAP  01/31/2027   HPV VACCINES  Completed   HIV Screening  Completed   Pneumococcal Vaccine 6-66 Years old  Aged Out      Review of Systems Patient reports no vision/ hearing changes, adenopathy,fever, weight change,  persistant/recurrent hoarseness , swallowing issues, chest pain, palpitations, edema, persistant/recurrent cough, hemoptysis, dyspnea (rest/exertional/paroxysmal nocturnal), gastrointestinal bleeding (melena, rectal bleeding), abdominal pain, significant heartburn, bowel changes, GU symptoms (dysuria, hematuria, incontinence), Gyn symptoms (abnormal  bleeding, pain),  syncope, focal weakness, memory loss, numbness & tingling, skin/hair/nail changes, abnormal bruising or bleeding.   + anxiety- will occur in almost panic attack like episodes, 1-2x/mo.  Will develop physical sxs as manifestation of anxiety.  Feels depression is well controlled on Fluoxetine.  This visit occurred during the SARS-CoV-2 public health emergency.  Safety protocols were in place, including screening questions prior to the visit, additional usage of staff PPE, and extensive cleaning of exam room while observing appropriate contact time as indicated for disinfecting solutions.      Objective:   Physical Exam General Appearance:    Alert, cooperative, no distress, appears stated age  Head:    Normocephalic, without obvious abnormality, atraumatic  Eyes:    PERRL, conjunctiva/corneas clear, EOM's intact, fundi    benign, both eyes  Ears:    Normal TM's and external ear canals, both ears  Nose:   Deferred due to  COVID  Throat:   Neck:   Supple, symmetrical, trachea midline, no adenopathy;    Thyroid: no enlargement/tenderness/nodules  Back:     Symmetric, no curvature, ROM normal, no CVA tenderness  Lungs:     Clear to auscultation bilaterally, respirations unlabored  Chest Wall:    No tenderness or deformity   Heart:    Regular rate and rhythm, S1 and S2 normal, no murmur, rub   or gallop  Breast Exam:    Deferred to GYN  Abdomen:     Soft, non-tender, bowel sounds active all four quadrants,    no masses, no organomegaly  Genitalia:    Deferred to GYN  Rectal:    Extremities:   Extremities normal, atraumatic, no cyanosis or edema  Pulses:   2+ and symmetric all extremities  Skin:   Skin color, texture, turgor normal, no rashes or lesions  Lymph nodes:   Cervical, supraclavicular, and axillary nodes normal  Neurologic:   CNII-XII intact, normal strength, sensation and reflexes    throughout          Assessment & Plan:

## 2020-11-15 NOTE — Assessment & Plan Note (Signed)
Pt is following at [facility].  Will get labs as requested.

## 2020-11-15 NOTE — Assessment & Plan Note (Signed)
Pt's PE WNL.  Pap is scheduled.  Will get flu shot at work.  UTD on Tdap.  Too young for mammo.  Check labs.  Anticipatory guidance provided.

## 2020-11-16 LAB — HEPATITIS C ANTIBODY
Hepatitis C Ab: NONREACTIVE
SIGNAL TO CUT-OFF: 0.04 (ref <1.00)

## 2020-11-17 LAB — CALCIUM / CREATININE RATIO, URINE
CALCIUM, RANDOM URINE: 14.4 mg/dL
CALCIUM/CREATININE RATIO: 114 mg/g creat (ref 10–320)
Creatinine, Urine: 126 mg/dL (ref 20–275)

## 2020-11-17 LAB — SODIUM, URINE, RANDOM: Sodium, Ur: 130 mmol/L (ref 28–272)

## 2020-11-19 ENCOUNTER — Telehealth: Payer: Self-pay

## 2020-11-19 NOTE — Telephone Encounter (Signed)
Left vm for patient letting her know her lab results were good. Asked that she call back with anything else that was needed

## 2020-11-19 NOTE — Telephone Encounter (Signed)
-----   Message from [place], [place] sent at 11/19/2020 12:00 PM EST -----  ----- Message ----- From: Midge Minium, MD Sent: 11/16/2020   7:19 AM EST To: Kelton Pillar, CMA  Labs look great!  No changes at this time

## 2020-11-22 LAB — AMINO ACID PROFILE, QN, URINE
Alanine Ur: 296.7 umol/g Cr (ref 77.9–1337.0)
Alloisoleucine Ur: 0.5 umol/g Cr (ref 0.1–13.5)
Alpha-aminoadipate Ur: 104.8 umol/g Cr (ref 0.5–146.7)
Alpha-aminobutyrate Ur: 15.5 umol/g Cr (ref 1.0–34.6)
Arginine Ur: 11.4 umol/g Cr (ref 5.0–69.6)
Argininosuccinate Ur: 4.2 umol/g Cr (ref 0.1–51.2)
Asparagine Ur: 152.6 umol/g Cr (ref 25.4–454.2)
Aspartate: 2.3 umol/g Cr (ref 1.0–86.7)
Beta-alanine Ur: 1.3 umol/g Cr (ref 1.0–869.8)
Beta-aminoisobutyrate Ur: 28.4 umol/g Cr (ref 0.5–807.9)
Citrulline Ur: 2.1 umol/g Cr (ref 1.0–27.4)
Cystathionine Ur: 10.5 umol/g Cr (ref 0.5–80.8)
Cystine Ur: 34.1 umol/g Cr (ref 0.3–223.8)
Gamma-aminobutyrate Ur: 2.1 umol/g Cr (ref 0.5–13.1)
Glutamate Ur: 9.8 umol/g Cr (ref 5.0–92.4)
Glutamine Ur: 435.9 umol/g Cr (ref 5.0–1756.2)
Glycine Ur: 994.2 umol/g Cr (ref 277.3–7996.9)
Histidine Ur: 669.6 umol/g Cr (ref 106.4–2534.2)
Homocitrulline Ur: 43.1 umol/g Cr (ref 0.5–80.0)
Homocystine Ur: 0.2 umol/g Cr — ABNORMAL LOW (ref 0.3–1.4)
Hydroxylysine Ur: 1.7 umol/g Cr (ref 0.1–37.3)
Hydroxyproline Ur: 2.2 umol/g Cr (ref 0.5–87.9)
Isoleucine Ur: 8.5 umol/g Cr (ref 5.0–48.1)
Leucine Ur: 23.7 umol/g Cr (ref 5.0–129.1)
Lysine Ur: 73.9 umol/g Cr (ref 15.3–1020.6)
Methionine Ur: 11.6 umol/g Cr (ref 1.0–37.1)
Ornithine Ur: 11.5 umol/g Cr (ref 5.0–76.3)
Phenylalanine Ur: 39.4 umol/g Cr (ref 5.0–239.0)
Proline Ur: 4.2 umol/g Cr — ABNORMAL LOW (ref 5.0–168.6)
Sarcosine Ur: 0.4 umol/g Cr — ABNORMAL LOW (ref 0.5–27.3)
Serine Ur: 292.3 umol/g Cr (ref 98.4–1052.8)
Taurine: 10.4 umol/g Cr — ABNORMAL LOW (ref 24.2–5335.7)
Threonine Ur: 186.3 umol/g Cr (ref 5.0–714.9)
Tryptophan Ur: 64.6 umol/g Cr (ref 1.0–207.5)
Tyrosine Ur: 60.6 umol/g Cr (ref 5.0–388.9)
Valine Ur: 33.8 umol/g Cr (ref 5.0–147.4)

## 2020-12-20 DIAGNOSIS — M25562 Pain in left knee: Secondary | ICD-10-CM | POA: Diagnosis not present

## 2020-12-20 DIAGNOSIS — S8392XA Sprain of unspecified site of left knee, initial encounter: Secondary | ICD-10-CM | POA: Diagnosis not present

## 2021-01-06 ENCOUNTER — Encounter: Payer: Self-pay | Admitting: Family Medicine

## 2021-01-13 DIAGNOSIS — Z202 Contact with and (suspected) exposure to infections with a predominantly sexual mode of transmission: Secondary | ICD-10-CM | POA: Diagnosis not present

## 2021-01-13 DIAGNOSIS — Z124 Encounter for screening for malignant neoplasm of cervix: Secondary | ICD-10-CM | POA: Diagnosis not present

## 2021-01-13 DIAGNOSIS — Z113 Encounter for screening for infections with a predominantly sexual mode of transmission: Secondary | ICD-10-CM | POA: Diagnosis not present

## 2021-01-13 DIAGNOSIS — Z6821 Body mass index (BMI) 21.0-21.9, adult: Secondary | ICD-10-CM | POA: Diagnosis not present

## 2021-01-13 DIAGNOSIS — Z01419 Encounter for gynecological examination (general) (routine) without abnormal findings: Secondary | ICD-10-CM | POA: Diagnosis not present

## 2021-01-13 DIAGNOSIS — Z1389 Encounter for screening for other disorder: Secondary | ICD-10-CM | POA: Diagnosis not present

## 2021-01-13 DIAGNOSIS — Z1151 Encounter for screening for human papillomavirus (HPV): Secondary | ICD-10-CM | POA: Diagnosis not present

## 2021-01-13 LAB — HM PAP SMEAR: HM Pap smear: NORMAL

## 2021-02-03 ENCOUNTER — Encounter: Payer: Self-pay | Admitting: Family Medicine

## 2021-02-24 DIAGNOSIS — H109 Unspecified conjunctivitis: Secondary | ICD-10-CM | POA: Diagnosis not present

## 2021-02-24 DIAGNOSIS — J029 Acute pharyngitis, unspecified: Secondary | ICD-10-CM | POA: Diagnosis not present

## 2021-03-08 ENCOUNTER — Ambulatory Visit: Payer: BC Managed Care – PPO | Admitting: Family Medicine

## 2021-03-08 ENCOUNTER — Encounter: Payer: Self-pay | Admitting: Family Medicine

## 2021-03-08 VITALS — BP 118/74 | HR 47 | Temp 97.6°F | Resp 16 | Ht 69.0 in | Wt 143.6 lb

## 2021-03-08 DIAGNOSIS — J329 Chronic sinusitis, unspecified: Secondary | ICD-10-CM

## 2021-03-08 DIAGNOSIS — B9689 Other specified bacterial agents as the cause of diseases classified elsewhere: Secondary | ICD-10-CM

## 2021-03-08 MED ORDER — AMOXICILLIN 875 MG PO TABS: 875.0000 mg | ORAL_TABLET | Freq: Two times a day (BID) | ORAL | 0 refills | Status: AC

## 2021-03-08 NOTE — Progress Notes (Signed)
? ?  Subjective:  ? ? Patient ID: Anna [name], female    DOB: 04-04-1990, 31 y.o.   MRN: [id number] ? ?HPI ?HA- pt reports 'it has been a rough 3 weeks'.  Initially had severe sore throat, pink eye, and intermittent dull, frontal HA.  + green nasal drainage.  Sore throat has resolved.  No fevers.  + facial pain, uncomfortable to lean forward.  Denies current tooth sensitivity.  Bilateral ear fullness but no pain.  + bloody nasal drainage.  Pt has hx of sinus infxns and this feels similar.  Took Zyrtec yesterday. ? ? ?Review of Systems ?For ROS see HPI  ? ?This visit occurred during the SARS-CoV-2 public health emergency.  Safety protocols were in place, including screening questions prior to the visit, additional usage of staff PPE, and extensive cleaning of exam room while observing appropriate contact time as indicated for disinfecting solutions.   ?   ?Objective:  ? Physical Exam ?Vitals reviewed.  ?Constitutional:   ?   General: She is not in acute distress. ?   Appearance: She is well-developed. She is not ill-appearing.  ?HENT:  ?   Head: Normocephalic and atraumatic.  ?   Right Ear: Tympanic membrane normal.  ?   Left Ear: Tympanic membrane normal.  ?   Nose: Mucosal edema and rhinorrhea present.  ?   Right Sinus: Maxillary sinus tenderness and frontal sinus tenderness present.  ?   Left Sinus: Maxillary sinus tenderness and frontal sinus tenderness present.  ?   Mouth/Throat:  ?   Pharynx: Uvula midline. Posterior oropharyngeal erythema present. No oropharyngeal exudate.  ?Eyes:  ?   Conjunctiva/sclera: Conjunctivae normal.  ?   Pupils: Pupils are equal, round, and reactive to light.  ?Cardiovascular:  ?   Rate and Rhythm: Normal rate and regular rhythm.  ?   Heart sounds: Normal heart sounds.  ?Pulmonary:  ?   Effort: Pulmonary effort is normal. No respiratory distress.  ?   Breath sounds: Normal breath sounds. No wheezing.  ?Musculoskeletal:  ?   Cervical back: Normal range of motion and neck supple.   ?Lymphadenopathy:  ?   Cervical: No cervical adenopathy.  ?Skin: ?   General: Skin is warm and dry.  ?Neurological:  ?   General: No focal deficit present.  ?   Mental Status: She is alert and oriented to person, place, and time.  ?   Cranial Nerves: No cranial nerve deficit.  ?   Motor: No weakness.  ?   Coordination: Coordination normal.  ?Psychiatric:     ?   Mood and Affect: Mood normal.     ?   Behavior: Behavior normal.     ?   Thought Content: Thought content normal.  ? ? ? ? ? ?   ?Assessment & Plan:  ? ?Bacterial sinusitis- new.  Pt's 3 weeks of sxs and ongoing facial pain, HA, and bloody/green nasal drainage are consistent w/ infxn.  Start abx.  Reviewed supportive care and red flags that should prompt return.  Pt expressed understanding and is in agreement w/ plan.  ?

## 2021-03-08 NOTE — Patient Instructions (Signed)
Follow up as needed or as scheduled ?START the Amoxicillin twice daily- take w/ food ?Drink LOTS of fluids ?ADD a daily Claritin or Zyrtec to help w/ allergy congestion ?Call with any questions or concerns ?Hang in there!!! ?

## 2021-03-21 ENCOUNTER — Telehealth: Payer: Self-pay

## 2021-03-21 ENCOUNTER — Other Ambulatory Visit: Payer: Self-pay

## 2021-03-21 DIAGNOSIS — F339 Major depressive disorder, recurrent, unspecified: Secondary | ICD-10-CM

## 2021-03-21 MED ORDER — FLUOXETINE HCL 20 MG PO CAPS: 20.0000 mg | ORAL_CAPSULE | Freq: Every day | ORAL | 3 refills | Status: DC

## 2021-03-21 NOTE — Telephone Encounter (Signed)
Encourage patient to contact the pharmacy for refills or they can request refills through [facility] ? ?(Please schedule appointment if patient has not been seen in over a year) ? ? ? ?WHAT PHARMACY WOULD THEY LIKE THIS SENT TO: [name] [phone] - [place], [place]  ?[address] [place], [place] [address]  ? ?MEDICATION NAME & DOSE:FLUoxetine (PROZAC) 20 MG capsule  ? ?NOTES/COMMENTS FROM PATIENT: ? ? ? ? ? ?[place] office please notify patient: ?It takes 48-72 hours to process rx refill requests ?Ask patient to call pharmacy to ensure rx is ready before heading there.  ? ?

## 2021-03-22 NOTE — Telephone Encounter (Signed)
Lvm for patient letting her know the rx was sent in yesterday.  ?

## 2021-04-04 ENCOUNTER — Ambulatory Visit: Payer: BC Managed Care – PPO | Admitting: Registered Nurse

## 2021-04-04 ENCOUNTER — Encounter: Payer: Self-pay | Admitting: Registered Nurse

## 2021-04-04 VITALS — BP 110/77 | HR 63 | Temp 98.1°F | Resp 18 | Ht 69.0 in | Wt 144.8 lb

## 2021-04-04 DIAGNOSIS — F339 Major depressive disorder, recurrent, unspecified: Secondary | ICD-10-CM

## 2021-04-04 DIAGNOSIS — R5382 Chronic fatigue, unspecified: Secondary | ICD-10-CM

## 2021-04-04 LAB — COMPREHENSIVE METABOLIC PANEL
ALT: 5 U/L (ref 0–35)
AST: 23 U/L (ref 0–37)
Albumin: 4.1 g/dL (ref 3.5–5.2)
Alkaline Phosphatase: 17 U/L — ABNORMAL LOW (ref 39–117)
BUN: 8 mg/dL (ref 6–23)
CO2: 23 mEq/L (ref 19–32)
Calcium: 9.6 mg/dL (ref 8.4–10.5)
Chloride: 102 mEq/L (ref 96–112)
Creatinine, Ser: 0.87 mg/dL (ref 0.40–1.20)
GFR: 87.65 mL/min (ref >60.00)
Glucose, Bld: 76 mg/dL (ref 70–99)
Potassium: 4.2 mEq/L (ref 3.5–5.1)
Sodium: 139 mEq/L (ref 135–145)
Total Bilirubin: 0.4 mg/dL (ref 0.2–1.2)
Total Protein: 6.8 g/dL (ref 6.0–8.3)

## 2021-04-04 LAB — CBC WITH DIFFERENTIAL/PLATELET
Basophils Absolute: 0 10*3/uL (ref 0.0–0.1)
Basophils Relative: 1.3 % (ref 0.0–3.0)
Eosinophils Absolute: 0.1 10*3/uL (ref 0.0–0.7)
Eosinophils Relative: 2.2 % (ref 0.0–5.0)
HCT: 37.5 % (ref 36.0–46.0)
Hemoglobin: 12.9 g/dL (ref 12.0–15.0)
Lymphocytes Relative: 27.2 % (ref 12.0–46.0)
Lymphs Abs: 1 10*3/uL (ref 0.7–4.0)
MCHC: 34.4 g/dL (ref 30.0–36.0)
MCV: 97.2 fl (ref 78.0–100.0)
Monocytes Absolute: 0.3 10*3/uL (ref 0.1–1.0)
Monocytes Relative: 8.5 % (ref 3.0–12.0)
Neutro Abs: 2.3 10*3/uL (ref 1.4–7.7)
Neutrophils Relative %: 60.8 % (ref 43.0–77.0)
Platelets: 308 10*3/uL (ref 150.0–400.0)
RBC: 3.86 Mil/uL — ABNORMAL LOW (ref 3.87–5.11)
RDW: 12.3 % (ref 11.5–15.5)
WBC: 3.8 10*3/uL — ABNORMAL LOW (ref 4.0–10.5)

## 2021-04-04 LAB — TSH: TSH: 1.02 u[IU]/mL (ref 0.35–5.50)

## 2021-04-04 MED ORDER — FLUOXETINE HCL 40 MG PO CAPS: 40.0000 mg | ORAL_CAPSULE | Freq: Every day | ORAL | 3 refills | Status: DC

## 2021-04-04 NOTE — Patient Instructions (Addendum)
Ms. Kutsch -  ? ?Great to meet you ? ?Labs today - will follow up as indicated ? ?Increase fluoxetine to '40mg'$  daily ? ?Touch base with myself or Dr. Birdie Riddle in 6 weeks to check in on this ? ?Call sooner with concerns ? ?Thanks, ? ?Rich  ? ? ? ?If you have lab work done today you will be contacted with your lab results within the next 2 weeks.  If you have not heard from [place] then please contact us. The fastest way to get your results is to register for My Chart. ? ? ?IF you received an x-ray today, you will receive an invoice from [facility]. Please contact [facility] at [phone] with questions or concerns regarding your invoice.  ? ?IF you received labwork today, you will receive an invoice from [facility]. Please contact [facility] at [phone] with questions or concerns regarding your invoice.  ? ?Our billing staff will not be able to assist you with questions regarding bills from these companies. ? ?You will be contacted with the lab results as soon as they are available. The fastest way to get your results is to activate your My Chart account. Instructions are located on the last page of this paperwork. If you have not heard from [place] regarding the results in 2 weeks, please contact this office. ?  ? ? ?

## 2021-04-04 NOTE — Progress Notes (Signed)
? ?Established Patient Office Visit ? ?Subjective:  ?Patient ID: Anna [name], female    DOB: [birth year]  Age: 31 y.o. MRN: [id number] ? ?CC:  ?Chief Complaint  ?Patient presents with  ? Medication Refill  ?  Patient states she would like to discuss medication dosage for Prozac. Patient states she has been more depressed and not feeling like herself. PHQ9=15  ? ? ?HPI ?Anna [name] presents for depression ? ?On fluoxetine '20mg'$  po qd. Has been on for around 3 years.  ?Has done counseling in the past - plans to restart this week.  ? ?Has been on sertraline and paxil in the past without effect ? ?Notes ongoing fatigue. Hx of borderline anemia.  ?Hx of hypophosphatemia but stable. ?No other symptoms or changes at this time.  ? ?Outpatient Medications Prior to Visit  ?Medication Sig Dispense Refill  ? BIOTIN PO Take by mouth.    ? drospirenone-ethinyl estradiol (YAZ) 3-0.02 MG tablet     ? valACYclovir (VALTREX) 500 MG tablet Valtrex 500 mg tablet ? Take 1 tablet every day by oral route.    ? FLUoxetine (PROZAC) 20 MG capsule Take 1 capsule (20 mg total) by mouth daily. 30 capsule 3  ? ?No facility-administered medications prior to visit.  ? ? ?Review of Systems  ?Constitutional:  Positive for fatigue. Negative for activity change, appetite change, chills, diaphoresis, fever and unexpected weight change.  ?HENT: Negative.    ?Eyes: Negative.   ?Respiratory: Negative.    ?Cardiovascular: Negative.   ?Gastrointestinal: Negative.   ?Genitourinary: Negative.   ?Musculoskeletal: Negative.   ?Skin: Negative.   ?Neurological: Negative.   ?Psychiatric/Behavioral:  Positive for dysphoric mood. Negative for suicidal ideas. The patient is nervous/anxious.   ?All other systems reviewed and are negative. ? ?  ?Objective:  ?  ? ?BP 110/77   Pulse 63   Temp 98.1 ?F (36.7 ?C) (Temporal)   Resp 18   Ht '5\' 9"'$  (1.753 m)   Wt 144 lb 12.8 oz (65.7 kg)   SpO2 100%   BMI 21.38 kg/m?  ? ?Wt Readings from Last 3 Encounters:  ?04/04/21 144 lb  12.8 oz (65.7 kg)  ?03/08/21 143 lb 9.6 oz (65.1 kg)  ?11/15/20 141 lb 9.6 oz (64.2 kg)  ? ?Physical Exam ?Constitutional:   ?   General: She is not in acute distress. ?   Appearance: Normal appearance. She is normal weight. She is not ill-appearing, toxic-appearing or diaphoretic.  ?HENT:  ?   Head: Normocephalic and atraumatic.  ?Skin: ?   Coloration: Skin is not jaundiced or pale.  ?   Findings: No bruising, erythema, lesion or rash.  ?Neurological:  ?   Mental Status: She is alert.  ?Psychiatric:     ?   Mood and Affect: Mood normal.     ?   Behavior: Behavior normal.     ?   Thought Content: Thought content normal.     ?   Judgment: Judgment normal.  ? ? ?No results found for any visits on 04/04/21. ? ? ? ?The ASCVD Risk score (Arnett DK, et al., 2019) failed to calculate for the following reasons: ?  The 2019 ASCVD risk score is only valid for ages 35 to 28 ? ?  ?Assessment & Plan:  ? ?Problem List Items Addressed This Visit   ? ?  ? Other  ? Depression, recurrent ([place]) - Primary  ? Relevant Medications  ? FLUoxetine (PROZAC) 40 MG capsule  ? ?Other Visit  Diagnoses   ? ? Chronic fatigue      ? Relevant Orders  ? CBC with Differential/Platelet  ? Comprehensive metabolic panel  ? TSH  ? T4  ? Iron, TIBC and Ferritin Panel  ? ?  ? ? ?Meds ordered this encounter  ?Medications  ? FLUoxetine (PROZAC) 40 MG capsule  ?  Sig: Take 1 capsule (40 mg total) by mouth daily.  ?  Dispense:  90 capsule  ?  Refill:  3  ?  Order Specific Question:   Supervising Provider  ?  Answer:   Carlota Raspberry, JEFFREY R [[id number]]  ? ? ?Return in about 6 weeks (around 05/16/2021) for med check.  ? ?PLAN ?Increase fluoxetine '40mg'$  po qd ?Labs for fatigue ?Med check in 6 weeks ?Patient encouraged to call clinic with any questions, comments, or concerns. ? ?Maximiano Coss, NP ?

## 2021-04-05 LAB — IRON,TIBC AND FERRITIN PANEL
%SAT: 18 % (calc) (ref 16–45)
Ferritin: 46 ng/mL (ref 16–154)
Iron: 71 ug/dL (ref 40–190)
TIBC: 389 mcg/dL (calc) (ref 250–450)

## 2021-04-05 LAB — T4: T4, Total: 9.9 ug/dL (ref 5.1–11.9)

## 2021-05-16 ENCOUNTER — Ambulatory Visit: Payer: BC Managed Care – PPO | Admitting: Family Medicine

## 2021-07-17 ENCOUNTER — Emergency Department (HOSPITAL_BASED_OUTPATIENT_CLINIC_OR_DEPARTMENT_OTHER): Payer: BC Managed Care – PPO

## 2021-07-17 ENCOUNTER — Encounter (HOSPITAL_BASED_OUTPATIENT_CLINIC_OR_DEPARTMENT_OTHER): Payer: Self-pay | Admitting: Emergency Medicine

## 2021-07-17 ENCOUNTER — Other Ambulatory Visit: Payer: Self-pay

## 2021-07-17 ENCOUNTER — Emergency Department (HOSPITAL_BASED_OUTPATIENT_CLINIC_OR_DEPARTMENT_OTHER)
Admission: EM | Admit: 2021-07-17 | Discharge: 2021-07-17 | Disposition: A | Discharge status: Home / Self Care | Payer: BC Managed Care – PPO | Attending: Emergency Medicine | Admitting: Emergency Medicine

## 2021-07-17 DIAGNOSIS — X501XXA Overexertion from prolonged static or awkward postures, initial encounter: Secondary | ICD-10-CM | POA: Diagnosis not present

## 2021-07-17 DIAGNOSIS — S14109A Unspecified injury at unspecified level of cervical spinal cord, initial encounter: Secondary | ICD-10-CM | POA: Diagnosis not present

## 2021-07-17 DIAGNOSIS — S161XXA Strain of muscle, fascia and tendon at neck level, initial encounter: Secondary | ICD-10-CM | POA: Diagnosis not present

## 2021-07-17 DIAGNOSIS — S199XXA Unspecified injury of neck, initial encounter: Secondary | ICD-10-CM | POA: Diagnosis not present

## 2021-07-17 DIAGNOSIS — X58XXXA Exposure to other specified factors, initial encounter: Secondary | ICD-10-CM | POA: Diagnosis not present

## 2021-07-17 DIAGNOSIS — S0990XA Unspecified injury of head, initial encounter: Secondary | ICD-10-CM | POA: Diagnosis not present

## 2021-07-17 DIAGNOSIS — M542 Cervicalgia: Secondary | ICD-10-CM | POA: Diagnosis not present

## 2021-07-17 DIAGNOSIS — T1490XA Injury, unspecified, initial encounter: Secondary | ICD-10-CM | POA: Diagnosis not present

## 2021-07-17 MED ORDER — ACETAMINOPHEN 500 MG PO TABS: 1000.0000 mg | ORAL_TABLET | Freq: Once | ORAL | Status: DC

## 2021-07-17 MED ORDER — METHOCARBAMOL 500 MG PO TABS: 500.0000 mg | ORAL_TABLET | Freq: Two times a day (BID) | ORAL | 0 refills | Status: DC

## 2021-07-17 MED ORDER — HYDROCODONE-ACETAMINOPHEN 5-325 MG PO TABS
2.0000 | ORAL_TABLET | Freq: Once | ORAL | Status: AC
  Administered 2021-07-17: 2 via ORAL
  Filled 2021-07-17: qty 2

## 2021-07-17 MED ORDER — IBUPROFEN 400 MG PO TABS
600.0000 mg | ORAL_TABLET | Freq: Once | ORAL | Status: AC
  Administered 2021-07-17: 600 mg via ORAL
  Filled 2021-07-17: qty 1

## 2021-07-17 NOTE — Discharge Instructions (Signed)
Use Tylenol 1000 mg and ibuprofen 600 mg together every 6 hours as needed for significant pain.  For mild to moderate pain you can rotate them every 3 hours for example take Tylenol at noon and ibuprofen at 3:00 and Tylenol at 6:00 PM Use heat pads or you can try muscle relaxant for muscle spasm but realize it can make you sleepy no driving with the. See clinician if you develop numbness, weakness or new concerns. Stay well-hydrated and good sleep cycles to help with head injury. Avoid lifting anything greater than 20 pounds with your shoulders/arms for the next for 5 days.

## 2021-07-17 NOTE — ED Notes (Signed)
Entered room and patient was crying in pain,had removed her c-collar. RN replaced c collar. Pt unable to find a comfortable position.
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
## 2021-07-26 DIAGNOSIS — M542 Cervicalgia: Secondary | ICD-10-CM | POA: Diagnosis not present

## 2021-11-16 ENCOUNTER — Encounter: Payer: Commercial Managed Care - PPO | Admitting: Family Medicine

## 2022-02-01 ENCOUNTER — Telehealth: Payer: 59 | Admitting: Physician Assistant

## 2022-02-01 DIAGNOSIS — B9689 Other specified bacterial agents as the cause of diseases classified elsewhere: Secondary | ICD-10-CM | POA: Diagnosis not present

## 2022-02-01 DIAGNOSIS — J019 Acute sinusitis, unspecified: Secondary | ICD-10-CM

## 2022-02-01 MED ORDER — FLUTICASONE PROPIONATE 50 MCG/ACT NA SUSP: 2.0000 | Freq: Every day | NASAL | 0 refills | Status: AC

## 2022-02-01 MED ORDER — AMOXICILLIN-POT CLAVULANATE 875-125 MG PO TABS: 1.0000 | ORAL_TABLET | Freq: Two times a day (BID) | ORAL | 0 refills | Status: AC

## 2022-02-01 NOTE — Patient Instructions (Signed)
Anna [name], thank you for joining [name], PA-C for today's virtual visit.  While this provider is not your primary care provider (PCP), if your PCP is located in our provider database this encounter information will be shared with them immediately following your visit.   [facility] account gives you access to today's visit and all your visits, tests, and labs performed at [facility] " click here if you don't have a [facility] account or go to [email or web address]  Consent: (Patient) Anna [name] provided verbal consent for this virtual visit at the beginning of the encounter.  Current Medications:  Current Outpatient Medications:    BIOTIN PO, Take by mouth., Disp: , Rfl:    drospirenone-ethinyl estradiol (YAZ) 3-0.02 MG tablet, , Disp: , Rfl:    FLUoxetine (PROZAC) 40 MG capsule, Take 1 capsule (40 mg total) by mouth daily., Disp: 90 capsule, Rfl: 3   methocarbamol (ROBAXIN) 500 MG tablet, Take 1 tablet (500 mg total) by mouth 2 (two) times daily., Disp: 10 tablet, Rfl: 0   valACYclovir (VALTREX) 500 MG tablet, Valtrex 500 mg tablet  Take 1 tablet every day by oral route., Disp: , Rfl:    Medications ordered in this encounter:  No orders of the defined types were placed in this encounter.    *If you need refills on other medications prior to your next appointment, please contact your pharmacy*  Follow-Up: Call back or seek an in-person evaluation if the symptoms worsen or if the condition fails to improve as anticipated.  [facility] [phone]  Other Instructions COVID test as discussed as a precaution. You only need to message [place] back if positive.   Please take antibiotic as directed.  Increase fluid intake.  Use Saline nasal spray.  Take a daily multivitamin. Ok to start Mucinex sinus OTC. Use the Flonase as directed.  Place a humidifier in the bedroom.  Please call or return clinic if symptoms are not  improving.  Sinusitis Sinusitis is redness, soreness, and swelling (inflammation) of the paranasal sinuses. Paranasal sinuses are air pockets within the bones of your face (beneath the eyes, the middle of the forehead, or above the eyes). In healthy paranasal sinuses, mucus is able to drain out, and air is able to circulate through them by way of your nose. However, when your paranasal sinuses are inflamed, mucus and air can become trapped. This can allow bacteria and other germs to grow and cause infection. Sinusitis can develop quickly and last only a short time (acute) or continue over a long period (chronic). Sinusitis that lasts for more than 12 weeks is considered chronic.  CAUSES  Causes of sinusitis include: Allergies. Structural abnormalities, such as displacement of the cartilage that separates your nostrils (deviated septum), which can decrease the air flow through your nose and sinuses and affect sinus drainage. Functional abnormalities, such as when the small hairs (cilia) that line your sinuses and help remove mucus do not work properly or are not present. SYMPTOMS  Symptoms of acute and chronic sinusitis are the same. The primary symptoms are pain and pressure around the affected sinuses. Other symptoms include: Upper toothache. Earache. Headache. Bad breath. Decreased sense of smell and taste. A cough, which worsens when you are lying flat. Fatigue. Fever. Thick drainage from your nose, which often is green and may contain pus (purulent). Swelling and warmth over the affected sinuses. DIAGNOSIS  Your caregiver will perform a physical exam. During the exam,  your caregiver may: Look in your nose for signs of abnormal growths in your nostrils (nasal polyps). Tap over the affected sinus to check for signs of infection. View the inside of your sinuses (endoscopy) with a special imaging device with a light attached (endoscope), which is inserted into your sinuses. If your  caregiver suspects that you have chronic sinusitis, one or more of the following tests may be recommended: Allergy tests. Nasal culture A sample of mucus is taken from your nose and sent to a lab and screened for bacteria. Nasal cytology A sample of mucus is taken from your nose and examined by your caregiver to determine if your sinusitis is related to an allergy. TREATMENT  Most cases of acute sinusitis are related to a viral infection and will resolve on their own within 10 days. Sometimes medicines are prescribed to help relieve symptoms (pain medicine, decongestants, nasal steroid sprays, or saline sprays).  However, for sinusitis related to a bacterial infection, your caregiver will prescribe antibiotic medicines. These are medicines that will help kill the bacteria causing the infection.  Rarely, sinusitis is caused by a fungal infection. In theses cases, your caregiver will prescribe antifungal medicine. For some cases of chronic sinusitis, surgery is needed. Generally, these are cases in which sinusitis recurs more than 3 times per year, despite other treatments. HOME CARE INSTRUCTIONS  Drink plenty of water. Water helps thin the mucus so your sinuses can drain more easily. Use a humidifier. Inhale steam 3 to 4 times a day (for example, sit in the bathroom with the shower running). Apply a warm, moist washcloth to your face 3 to 4 times a day, or as directed by your caregiver. Use saline nasal sprays to help moisten and clean your sinuses. Take over-the-counter or prescription medicines for pain, discomfort, or fever only as directed by your caregiver. SEEK IMMEDIATE MEDICAL CARE IF: You have increasing pain or severe headaches. You have nausea, vomiting, or drowsiness. You have swelling around your face. You have vision problems. You have a stiff neck. You have difficulty breathing. MAKE SURE YOU:  Understand these instructions. Will watch your condition. Will get help right away  if you are not doing well or get worse. Document Released: 12/19/2004 Document Revised: 03/13/2011 Document Reviewed: 01/03/2011 [facility] Patient Information 2014 [place], [place].    If you have been instructed to have an in-person evaluation today at a local Urgent Care facility, please use the link below. It will take you to a list of all of our available [facility], including address, phone number and hours of operation. Please do not delay care.  [facility]  If you or a family member do not have a primary care provider, use the link below to schedule a visit and establish care. When you choose a [facility] primary care physician or advanced practice provider, you gain a long-term partner in health. Find a Primary Care Provider  Learn more about [facility] in-office and virtual care options: [facility]

## 2022-02-01 NOTE — Progress Notes (Signed)
Virtual Visit Consent   Anna [name], you are scheduled for a virtual visit with a [facility] provider today. Just as with appointments in the office, your consent must be obtained to participate. Your consent will be active for this visit and any virtual visit you may have with one of our providers in the next 365 days. If you have a MyChart account, a copy of this consent can be sent to you electronically.  As this is a virtual visit, video technology does not allow for your provider to perform a traditional examination. This may limit your provider's ability to fully assess your condition. If your provider identifies any concerns that need to be evaluated in person or the need to arrange testing (such as labs, EKG, etc.), we will make arrangements to do so. Although advances in technology are sophisticated, we cannot ensure that it will always work on either your end or our end. If the connection with a video visit is poor, the visit may have to be switched to a telephone visit. With either a video or telephone visit, we are not always able to ensure that we have a secure connection.  By engaging in this virtual visit, you consent to the provision of healthcare and authorize for your insurance to be billed (if applicable) for the services provided during this visit. Depending on your insurance coverage, you may receive a charge related to this service.  I need to obtain your verbal consent now. Are you willing to proceed with your visit today? LEONARDA LEIS has provided verbal consent on 02/01/2022 for a virtual visit (video or telephone). Anna [name], [place]  Date: 02/01/2022 4:35 PM  Virtual Visit via Video Note   I, Anna [name], connected with  Anna [name]  ([id number], August 13, 1990) on 02/01/22 at  4:30 PM EST by a video-enabled telemedicine application and verified that I am speaking with the correct person using two identifiers.  Location: Patient: Virtual Visit Location Patient:  Mobile Provider: Virtual Visit Location Provider: Home Office   I discussed the limitations of evaluation and management by telemedicine and the availability of in person appointments. The patient expressed understanding and agreed to proceed.    History of Present Illness: Anna [name] is a 32 y.o. who identifies as a female who was assigned female at birth, and is being seen today for ongoing and recurring sinus symptoms. Notes initially 3 weeks ago started with URI symptoms that initially improved without resolution, just lingering. In the past several days progressively worsening again with sinus pressure, sinus pain and thick discharge. Noting some associated cough, worse at night. Denies any known COVID exposure. Has not tested for COVID.   OTC -- Initially was taking Mucinex OTC. Since recurrence has taken Nyquil last night.   Is taking OCP. No concern for pregnancy.    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   Anorexia nervosa 03/26/2012   Depression, recurrent ([place]) 07/08/2008   PYLOROSPASM 07/08/2008   IRRITABLE BOWEL SYNDROME 07/08/2008    Allergies: No Known Allergies Medications:  Current Outpatient Medications:    amoxicillin-clavulanate (AUGMENTIN) 875-125 MG tablet, Take 1 tablet by mouth 2 (two) times daily., Disp: 14 tablet, Rfl: 0   fluticasone (FLONASE) 50 MCG/ACT nasal spray, Place 2 sprays into both nostrils daily., Disp: 16 g,  Rfl: 0   BIOTIN PO, Take by mouth., Disp: , Rfl:    drospirenone-ethinyl estradiol (YAZ) 3-0.02 MG tablet, , Disp: , Rfl:    FLUoxetine (PROZAC) 40 MG capsule, Take 1 capsule (40 mg total) by mouth daily., Disp: 90 capsule, Rfl: 3   valACYclovir (VALTREX) 500 MG tablet, Valtrex 500 mg tablet  Take 1 tablet every day by oral route., Disp: , Rfl:    Observations/Objective: Patient is well-developed, well-nourished in no acute distress.  Resting comfortably in parked car.  Head is normocephalic, atraumatic.  No labored breathing. Speech is clear and coherent with logical content.  Patient is alert and oriented at baseline.   Assessment and Plan: 1. Acute bacterial sinusitis - amoxicillin-clavulanate (AUGMENTIN) 875-125 MG tablet; Take 1 tablet by mouth 2 (two) times daily.  Dispense: 14 tablet; Refill: 0 - fluticasone (FLONASE) 50 MCG/ACT nasal spray; Place 2 sprays into both nostrils daily.  Dispense: 16 g; Refill: 0  Seems most consistent with sinusitis giving overall chronicity and double worsening. Will have her COVID test just to be cautious giving change in symptoms over past couple of days. If this happens to come back positive, she is to let us know ASAP. For now will proceed with treatment for sinusitis. Rx Augmentin.  Increase fluids.  Rest.  Saline nasal spray.  Probiotic.  Mucinex Sinus as directed.  Humidifier in bedroom. Flonase per orders.  Call or return to clinic if symptoms are not improving.   Follow Up Instructions: I discussed the assessment and treatment plan with the patient. The patient was provided an opportunity to ask questions and all were answered. The patient agreed with the plan and demonstrated an understanding of the instructions.  A copy of instructions were sent to the patient via MyChart unless otherwise noted below.   The patient was advised to call back or seek an in-person evaluation if the symptoms worsen or if the condition fails to improve as anticipated.  Time:  I spent 10 minutes with the patient via telehealth technology discussing the above problems/concerns.    Anna Rio, PA-C

## 2022-09-21 ENCOUNTER — Telehealth: Payer: Self-pay | Admitting: Family Medicine

## 2022-09-21 NOTE — Telephone Encounter (Signed)
Error. Pt has seen Dr.Tabori.  Will send to Dr.Tabori to advise

## 2022-09-21 NOTE — Telephone Encounter (Signed)
Encourage patient to contact the pharmacy for refills or they can request refills through [facility]  (Please schedule appointment if patient has not been seen in over a year)    WHAT PHARMACY WOULD THEY LIKE THIS SENT TO: [id number] - [place], [place] - [address]   MEDICATION NAME & DOSE: FLUoxetine (PROZAC) 40 MG capsule   NOTES/COMMENTS FROM PATIENT:      Front office please notify patient: It takes 48-72 hours to process rx refill requests Ask patient to call pharmacy to ensure rx is ready before heading there.

## 2022-09-21 NOTE — Telephone Encounter (Signed)
Pt was last seen 04/04/2021 Anna [name]. Pt has not seen Dr.Tabori. Pt needs [facility] appt.

## 2022-09-22 ENCOUNTER — Other Ambulatory Visit: Payer: Self-pay

## 2022-09-22 DIAGNOSIS — F339 Major depressive disorder, recurrent, unspecified: Secondary | ICD-10-CM

## 2022-09-22 MED ORDER — FLUOXETINE HCL 40 MG PO CAPS: 40.0000 mg | ORAL_CAPSULE | Freq: Every day | ORAL | 0 refills | Status: DC

## 2022-09-22 NOTE — Telephone Encounter (Signed)
Ok to send #30, 0 refills and advise pt that she is overdue for an appt.  Last CPE was 11/15/2020

## 2022-09-22 NOTE — Telephone Encounter (Signed)
Sent Rx with note to pharmacy that pt needs to make an appointment

## 2022-09-22 NOTE — Telephone Encounter (Signed)
Called to make and appointment, no answer, LM to call back

## 2022-09-27 ENCOUNTER — Ambulatory Visit: Payer: 59 | Admitting: Family Medicine

## 2022-10-15 ENCOUNTER — Other Ambulatory Visit: Payer: Self-pay | Admitting: Family Medicine

## 2022-10-15 DIAGNOSIS — F339 Major depressive disorder, recurrent, unspecified: Secondary | ICD-10-CM

## 2022-10-18 ENCOUNTER — Emergency Department (HOSPITAL_BASED_OUTPATIENT_CLINIC_OR_DEPARTMENT_OTHER): Payer: 59 | Admitting: Radiology

## 2022-10-18 ENCOUNTER — Encounter (HOSPITAL_BASED_OUTPATIENT_CLINIC_OR_DEPARTMENT_OTHER): Payer: Self-pay | Admitting: Emergency Medicine

## 2022-10-18 ENCOUNTER — Emergency Department (HOSPITAL_BASED_OUTPATIENT_CLINIC_OR_DEPARTMENT_OTHER)
Admission: EM | Admit: 2022-10-18 | Discharge: 2022-10-18 | Disposition: A | Discharge status: Home / Self Care | Payer: 59

## 2022-10-18 ENCOUNTER — Telehealth: Payer: Self-pay | Admitting: Family Medicine

## 2022-10-18 ENCOUNTER — Other Ambulatory Visit: Payer: Self-pay

## 2022-10-18 ENCOUNTER — Other Ambulatory Visit (HOSPITAL_BASED_OUTPATIENT_CLINIC_OR_DEPARTMENT_OTHER): Payer: Self-pay

## 2022-10-18 DIAGNOSIS — R072 Precordial pain: Secondary | ICD-10-CM | POA: Diagnosis not present

## 2022-10-18 DIAGNOSIS — R079 Chest pain, unspecified: Secondary | ICD-10-CM | POA: Diagnosis not present

## 2022-10-18 LAB — D-DIMER, QUANTITATIVE: D-Dimer, Quant: <0.27 ug{FEU}/mL (ref 0.00–0.50)

## 2022-10-18 LAB — BASIC METABOLIC PANEL
Anion gap: 9 (ref 5–15)
BUN: 9 mg/dL (ref 6–20)
CO2: 26 mmol/L (ref 22–32)
Calcium: 9.3 mg/dL (ref 8.9–10.3)
Chloride: 103 mmol/L (ref 98–111)
Creatinine, Ser: 0.99 mg/dL (ref 0.44–1.00)
GFR, Estimated: >60 mL/min (ref >60)
Glucose, Bld: 102 mg/dL — ABNORMAL HIGH (ref 70–99)
Potassium: 3.5 mmol/L (ref 3.5–5.1)
Sodium: 138 mmol/L (ref 135–145)

## 2022-10-18 LAB — CBC
HCT: 35.2 % — ABNORMAL LOW (ref 36.0–46.0)
Hemoglobin: 12.3 g/dL (ref 12.0–15.0)
MCH: 32.7 pg (ref 26.0–34.0)
MCHC: 34.9 g/dL (ref 30.0–36.0)
MCV: 93.6 fL (ref 80.0–100.0)
Platelets: 250 10*3/uL (ref 150–400)
RBC: 3.76 MIL/uL — ABNORMAL LOW (ref 3.87–5.11)
RDW: 12 % (ref 11.5–15.5)
WBC: 4.6 10*3/uL (ref 4.0–10.5)
nRBC: 0 % (ref 0.0–0.2)

## 2022-10-18 LAB — TROPONIN I (HIGH SENSITIVITY): Troponin I (High Sensitivity): <2 ng/L (ref <18)

## 2022-10-18 LAB — PREGNANCY, URINE: Preg Test, Ur: NEGATIVE

## 2022-10-18 NOTE — Discharge Instructions (Signed)
As we discussed, your workup in the ER today was reassuring for acute findings.  Laboratory evaluation and x-ray imaging as well as EKG did not reveal any emergent cause of your symptoms.  I recommend that you on ibuprofen as needed for pain. You could also try a more supportive bra given that the pain seems to be muscular directly underneath your breast area. Follow-up with your primary doctor as well as your plastic surgeon for continued evaluation and management of your symptoms.  Return if development of any new or worsening symptoms.

## 2022-10-18 NOTE — Telephone Encounter (Signed)
Pt called after leaving an appointment with her plastic surgeon. Surgeon recently performed a breast augmentation, pt did receive implants. Today her appointment consisted of lots of pain under her left breast. Surgeon recommended F/U with PCP due to his belief that it could be cardiac or lung related. Surgeon also stated the area where she is hurting feels "hard." I took her call, advised her that I needed to have her triaged and transferred her over. Awaiting results of triage.

## 2022-10-18 NOTE — ED Triage Notes (Signed)
Pt having 7 or 8 days of pain under left breast. She went to her plastic surgeon to see if related to implants and he does not think so. Was intermittent, now constant. Sharp and stinging sensation.

## 2022-10-18 NOTE — Telephone Encounter (Signed)
Pt is going to ED

## 2022-10-18 NOTE — ED Provider Notes (Signed)
EMERGENCY DEPARTMENT AT [facility] Provider Note   CSN: [id number] Arrival date & time: 10/18/22  1215     History  No chief complaint on file.   Anna [name] is a 32 y.o. female.  Patient with history of bulimia presents today with complaints of chest pain. She states that same began initially 8 days ago and was initially intermittent in nature and is now constant. Pain is underneath her left breast and does not radiate. She does add that she had silicone breast implants placed by Dr. Izora Ribas in [place] in 2019. She thought the implant was likely causing her pain, however she made an appointment with Dr. Izora Ribas and he saw and evaluated her and did not think it was an issue with the implant. He did not perform any imaging at this appointment. She states that it hurts to mover her left arm or to take a deep breath. It also hurts to move or lift her breast. She has never felt this pain before. Denies fevers or chills. She denies any recent travel or surgeries, no leg pain or swelling. Does note that she is on estrogen OCPs. No history of blood clots or malignancy. No cardiac history. She does not smoke. No nausea or vomiting.  The history is provided by the patient. No language interpreter was used.       Home Medications Prior to Admission medications   Medication Sig Start Date End Date Taking? Authorizing Provider  amoxicillin-clavulanate (AUGMENTIN) 875-125 MG tablet Take 1 tablet by mouth 2 (two) times daily. 02/01/22   Waldon Merl, PA-C  BIOTIN PO Take by mouth.    [provider]  drospirenone-ethinyl estradiol (YAZ) 3-0.02 MG tablet  06/19/17   [provider]  FLUoxetine (PROZAC) 40 MG capsule TAKE 1 CAPSULE (40 MG TOTAL) BY MOUTH DAILY. 10/16/22   Sheliah Hatch, MD  fluticasone (FLONASE) 50 MCG/ACT nasal spray Place 2 sprays into both nostrils daily. 02/01/22   Waldon Merl, PA-C  valACYclovir (VALTREX) 500 MG tablet Valtrex  500 mg tablet  Take 1 tablet every day by oral route.    [provider]      Allergies    Patient has no known allergies.    Review of Systems   Review of Systems  Cardiovascular:  Positive for chest pain.  All other systems reviewed and are negative.   Physical Exam Updated Vital Signs BP 128/84   Pulse 63   Temp (!) 97.1 F (36.2 C) (Oral)   Resp 20   SpO2 100%  Physical Exam Vitals and nursing note reviewed.  Constitutional:      General: She is not in acute distress.    Appearance: Normal appearance. She is normal weight. She is not ill-appearing, toxic-appearing or diaphoretic.  HENT:     Head: Normocephalic and atraumatic.  Cardiovascular:     Rate and Rhythm: Normal rate and regular rhythm.     Heart sounds: Normal heart sounds.  Pulmonary:     Effort: Pulmonary effort is normal. No respiratory distress.     Breath sounds: Normal breath sounds.  Chest:     Comments: Well healed surgical scar noted to the left breast. No erythema or warmth, no nipple drainage. TTP upon lifting the left breast and TTP under the left breast. No overlying skin changes. Abdominal:     General: Abdomen is flat.     Palpations: Abdomen is soft.     Tenderness: There is no abdominal  tenderness.  Musculoskeletal:        General: No tenderness. Normal range of motion.     Cervical back: Normal range of motion.     Right lower leg: No edema.     Left lower leg: No edema.  Skin:    General: Skin is warm and dry.  Neurological:     General: No focal deficit present.     Mental Status: She is alert.  Psychiatric:        Mood and Affect: Mood normal.        Behavior: Behavior normal.     ED Results / Procedures / Treatments   Labs (all labs ordered are listed, but only abnormal results are displayed) Labs Reviewed  BASIC METABOLIC PANEL - Abnormal; Notable for the following components:      Result Value   Glucose, Bld 102 (*)    All other components within normal  limits  CBC - Abnormal; Notable for the following components:   RBC 3.76 (*)    HCT 35.2 (*)    All other components within normal limits  PREGNANCY, URINE  D-DIMER, QUANTITATIVE  TROPONIN I (HIGH SENSITIVITY)    EKG None  Radiology DG Chest 2 View  Result Date: 10/18/2022 CLINICAL DATA:  Chest pain EXAM: CHEST - 2 VIEW COMPARISON:  04/21/2016 FINDINGS: The heart size and mediastinal contours are within normal limits. Both lungs are clear. The visualized skeletal structures are unremarkable. IMPRESSION: No active cardiopulmonary disease. Electronically Signed   By: Judie Petit.  Shick M.D.   On: 10/18/2022 15:47    Procedures Procedures    Medications Ordered in ED Medications - No data to display  ED Course/ Medical Decision Making/ A&P                                 Medical Decision Making Amount and/or Complexity of Data Reviewed Labs: ordered. Radiology: ordered.   This patient is a 32 y.o. female who presents to the ED for concern of chest pain, this involves an extensive number of treatment options, and is a complaint that carries with it a high risk of complications and morbidity. The emergent differential diagnosis prior to evaluation includes, but is not limited to, complication with breast implant including infection, silicone leak, surgical complication. ACS, pericarditis, myocarditis, aortic dissection, PE, pneumothorax, esophageal spasm or rupture, chronic angina, pneumonia, bronchitis, GERD, reflux/PUD, biliary disease, pancreatitis, costochondritis, anxiety    This is not an exhaustive differential.   Past Medical History / Co-morbidities / Social History:  has a past medical history of Abnormal pap (2012/2013), Anorexia (03/2011), Anxiety, Depression, Eating disorder, HSV (herpes simplex virus) anogenital infection, IBS (irritable bowel syndrome), Normal pregnancy in third trimester (08/14/2015), STD (sexually transmitted disease) (11/2012), SVD (spontaneous vaginal  delivery) (08/14/2015), and SVD (spontaneous vaginal delivery) (04/25/2017).  Breast implant surgery with Dr. Izora Ribas in 2019  Additional history: Chart reviewed.  Physical Exam: Physical exam performed. The pertinent findings include: TTP left breast around the incision site which is noninfectious appearing without palpable or visualized abnormalities.  Lab Tests: I ordered, and personally interpreted labs.  The pertinent results include:  Troponin negative, d-dimer WNL. No acute laboratory findings   Imaging Studies: I ordered imaging studies including CXR. I independently visualized and interpreted imaging which showed NAD. I agree with the radiologist interpretation.   Cardiac Monitoring:  The patient was maintained on a cardiac monitor.  Cardiac monitor showed an underlying  rhythm of: sinus rhythm, no STEMI. I agree with this interpretation.   Disposition: After consideration of the diagnostic results and the patients response to treatment, I feel that emergency department workup does not suggest an emergent condition requiring admission or immediate intervention beyond what has been performed at this time. The plan is: discharge with close outpatient follow-up and return precautions. Work-up is benign, vital signs stable, patient well appearing. Pain seems exclusively in the area of her implant, no infectious signs or symptoms. No palpable abnormalities. Could be muscular as well. Recommend NSAIDs and pcp follow-up as well as potentially going back to her surgeon. Also make benefit from a more supportive bra given that the pain seems to be with breast movement. Evaluation and diagnostic testing in the emergency department does not suggest an emergent condition requiring admission or immediate intervention beyond what has been performed at this time.  Plan for discharge with close PCP follow-up.  Patient is understanding and amenable with plan, educated on red flag symptoms that would prompt  immediate return.  Patient discharged in stable condition.  Final Clinical Impression(s) / ED Diagnoses Final diagnoses:  Precordial chest pain    Rx / DC Orders ED Discharge Orders     None     An After Visit Summary was printed and given to the patient.     Silva Bandy, PA-C 10/18/22 1626    Coral Spikes, DO 10/27/22 [phone]

## 2022-10-18 NOTE — Telephone Encounter (Signed)
Patient Name First: Anna Last: [name] Gender: Female DOB: 1990/12/26 Age: 32 Y 9 M 12 D Return Phone Number: [phone] (Primary) Address: City/ State/ Zip: [place]  [address] Client [facility] - Cli Client Site [facility] Day Provider Lezlie Octave- MD Contact Type Call Who Is Calling Patient / Member / Family / Caregiver Call Type Triage / Clinical Relationship To Patient Self Return Phone Number [phone] (Primary) Chief Complaint CHEST PAIN - pain, pressure, heaviness or tightness Reason for Call Symptomatic / Request for Health Information Initial Comment Caller states she had a breast implant aug 5 years ago and breast pain under her breast and hurts to take a deep breath plastic surgeon states it could be cardiac or lung related Translation No Nurse Assessment Nurse: Annye English, RN, Angelique Blonder Date/Time (Eastern Time): 10/18/2022 11:26:32 AM Confirm and document reason for call. If symptomatic, describe symptoms. ---Pt having left sided CP. Her surgeon r/o breast implant issues. Does the patient have any new or worsening symptoms? ---Yes Will a triage be completed? ---Yes Related visit to physician within the last 2 weeks? ---Yes Does the PT have any chronic conditions? (i.e. diabetes, asthma, this includes High risk factors for pregnancy, etc.) ---No Is the patient pregnant or possibly pregnant? (Ask all females between the ages of 51-55) ---No Is this a behavioral health or substance abuse call? ---No Guidelines Guideline Title Affirmed Question Affirmed Notes Nurse Date/Time (Eastern Time) Chest Pain [1] Chest pain lasts > 5 minutes AND [2] described as crushing, pressure-like, or heavy Carmon, RN, Denise 10/18/2022 11:27:57 AM PLEASE NOTE: All timestamps contained within this report are represented as [place] Standard Time. CONFIDENTIALTY NOTICE: This fax transmission is intended only  for the addressee. It contains information that is legally privileged, confidential or otherwise protected from use or disclosure. If you are not the intended recipient, you are strictly prohibited from reviewing, disclosing, copying using or disseminating any of this information or taking any action in reliance on or regarding this information. If you have received this fax in error, please notify us immediately by telephone so that we can arrange for its return to [place]. Phone: [phone], Toll-Free: [phone], Fax: [phone] Page: 2 of 2 Call Id: [id number] Disp. Time Lamount Cohen Time) Disposition Final User 10/18/2022 11:21:25 AM Send to Urgent Ortencia Kick 10/18/2022 11:31:09 AM Call EMS 911 Now Yes Carmon, RN, Angelique Blonder 10/18/2022 11:32:47 AM 911 Outcome Documentation Carmon, RN, Angelique Blonder Reason: Pt refused to dial 911 and having someone take her to ER. Final Disposition 10/18/2022 11:31:09 AM Call EMS 911 Now Yes Carmon, RN, Leighton Ruff Disagree/Comply Disagree Caller Understands Yes PreDisposition Call Doctor Care Advice Given Per Guideline CALL EMS 911 NOW: CARE ADVICE given per Chest Pain (Adult) guideline. Comments User: Greggory Stallion, RN Date/Time Lamount Cohen Time): 10/18/2022 11:28:11 AM H/O Bule[place] Referrals [facility] - ED  Cassie will F/U ASAP

## 2022-10-23 ENCOUNTER — Telehealth: Payer: Self-pay | Admitting: Family Medicine

## 2022-10-23 NOTE — Telephone Encounter (Signed)
Pt stated that she went to the ER as directed by Triage. They did not find anything heart or lung related, however requested that she get an MRI ordered. She states even after the ER visit she is still in a lot of pain and is prevented from being as active as she usually is.

## 2022-10-24 NOTE — Telephone Encounter (Signed)
Sent to Dr.Tabori  Called let message to see if I can get pt to make appt. To be seen by Dr.Tabori

## 2022-10-24 NOTE — Telephone Encounter (Signed)
Pt has hospital F/U appt. She states she would just like a MRI and no need to see Dr.Tabori. I told her I would make Dr.Tabori aware of her request and we will call her back once Dr.Tabori has reviewed this.

## 2022-10-24 NOTE — Telephone Encounter (Signed)
See encounter from 10/23/22

## 2022-10-24 NOTE — Telephone Encounter (Signed)
Please make appt if she calls back.

## 2022-10-24 NOTE — Telephone Encounter (Signed)
Pt returned your call.  

## 2022-10-25 NOTE — Telephone Encounter (Signed)
Pt has been made aware and if not ordered by Dr Izora Ribas by Monday appt would like to discuss you ordering at that time

## 2022-10-25 NOTE — Telephone Encounter (Signed)
Unfortunately I can't order an MRI without an office visit.  Since she saw Dr Izora Ribas, he could order an MRI for her as he has supporting documentation.  Otherwise, she will need to be seen in office

## 2022-10-27 DIAGNOSIS — R9431 Abnormal electrocardiogram [ECG] [EKG]: Secondary | ICD-10-CM | POA: Diagnosis not present

## 2022-10-27 DIAGNOSIS — R0789 Other chest pain: Secondary | ICD-10-CM | POA: Diagnosis not present

## 2022-10-27 DIAGNOSIS — R079 Chest pain, unspecified: Secondary | ICD-10-CM | POA: Diagnosis not present

## 2022-10-27 DIAGNOSIS — R001 Bradycardia, unspecified: Secondary | ICD-10-CM | POA: Diagnosis not present

## 2022-10-27 DIAGNOSIS — Z9882 Breast implant status: Secondary | ICD-10-CM | POA: Diagnosis not present

## 2022-10-27 DIAGNOSIS — N644 Mastodynia: Secondary | ICD-10-CM | POA: Diagnosis not present

## 2022-10-27 DIAGNOSIS — M791 Myalgia, unspecified site: Secondary | ICD-10-CM | POA: Diagnosis not present

## 2022-10-30 ENCOUNTER — Inpatient Hospital Stay: Payer: 59 | Admitting: Family Medicine

## 2022-10-30 ENCOUNTER — Other Ambulatory Visit: Payer: Self-pay | Admitting: General Surgery

## 2022-10-30 ENCOUNTER — Encounter: Payer: Self-pay | Admitting: Family Medicine

## 2022-10-30 ENCOUNTER — Encounter: Payer: Self-pay | Admitting: General Surgery

## 2022-10-30 DIAGNOSIS — Z1231 Encounter for screening mammogram for malignant neoplasm of breast: Secondary | ICD-10-CM

## 2022-10-30 DIAGNOSIS — N644 Mastodynia: Secondary | ICD-10-CM

## 2022-10-31 ENCOUNTER — Encounter: Payer: Self-pay | Admitting: General Surgery

## 2022-11-02 ENCOUNTER — Ambulatory Visit
Admission: RE | Admit: 2022-11-02 | Discharge: 2022-11-02 | Disposition: A | Discharge status: Home / Self Care | Payer: 59 | Source: Ambulatory Visit | Attending: General Surgery | Admitting: General Surgery

## 2022-11-02 ENCOUNTER — Other Ambulatory Visit: Payer: Self-pay | Admitting: General Surgery

## 2022-11-02 DIAGNOSIS — N644 Mastodynia: Secondary | ICD-10-CM

## 2022-11-02 DIAGNOSIS — R928 Other abnormal and inconclusive findings on diagnostic imaging of breast: Secondary | ICD-10-CM | POA: Diagnosis not present

## 2022-11-03 ENCOUNTER — Other Ambulatory Visit: Payer: Self-pay | Admitting: General Surgery

## 2022-11-03 ENCOUNTER — Encounter: Payer: Self-pay | Admitting: General Surgery

## 2022-11-03 DIAGNOSIS — N6489 Other specified disorders of breast: Secondary | ICD-10-CM

## 2022-11-06 ENCOUNTER — Encounter: Payer: Self-pay | Admitting: General Surgery

## 2022-11-07 ENCOUNTER — Encounter: Payer: Self-pay | Admitting: General Surgery

## 2022-11-08 ENCOUNTER — Other Ambulatory Visit: Payer: 59

## 2022-12-14 DIAGNOSIS — F418 Other specified anxiety disorders: Secondary | ICD-10-CM | POA: Diagnosis not present

## 2022-12-14 DIAGNOSIS — F509 Eating disorder, unspecified: Secondary | ICD-10-CM | POA: Diagnosis not present

## 2022-12-14 DIAGNOSIS — Z01419 Encounter for gynecological examination (general) (routine) without abnormal findings: Secondary | ICD-10-CM | POA: Diagnosis not present

## 2022-12-14 DIAGNOSIS — A609 Anogenital herpesviral infection, unspecified: Secondary | ICD-10-CM | POA: Diagnosis not present

## 2022-12-14 DIAGNOSIS — Z1389 Encounter for screening for other disorder: Secondary | ICD-10-CM | POA: Diagnosis not present

## 2022-12-14 DIAGNOSIS — Z3041 Encounter for surveillance of contraceptive pills: Secondary | ICD-10-CM | POA: Diagnosis not present

## 2022-12-14 DIAGNOSIS — Z01411 Encounter for gynecological examination (general) (routine) with abnormal findings: Secondary | ICD-10-CM | POA: Diagnosis not present

## 2022-12-14 DIAGNOSIS — Z113 Encounter for screening for infections with a predominantly sexual mode of transmission: Secondary | ICD-10-CM | POA: Diagnosis not present

## 2022-12-14 DIAGNOSIS — L709 Acne, unspecified: Secondary | ICD-10-CM | POA: Diagnosis not present

## 2022-12-14 DIAGNOSIS — Z202 Contact with and (suspected) exposure to infections with a predominantly sexual mode of transmission: Secondary | ICD-10-CM | POA: Diagnosis not present

## 2022-12-21 IMAGING — CR DG BONE SURVEY MET
8 of 10 series · 8 of 10 positions shown · non-contrast
Comparison: None.

CLINICAL DATA: Hypophosphaturia

EXAM:
METASTATIC BONE SURVEY

[w chest pa]
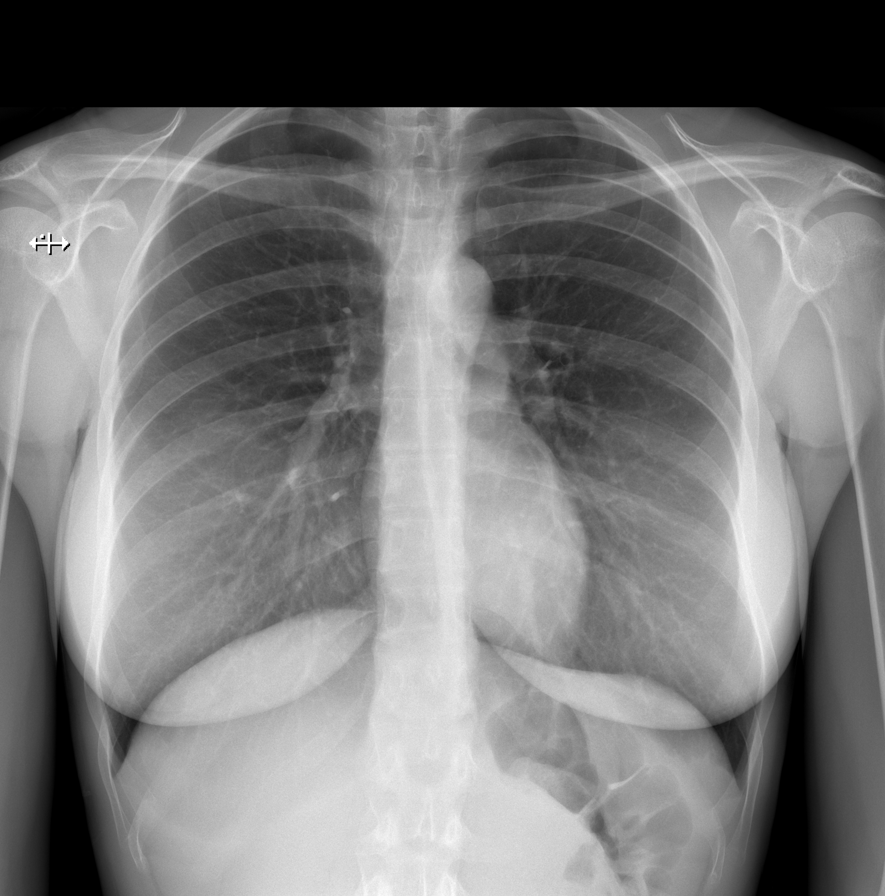

[w chest lat (1 of 2)]
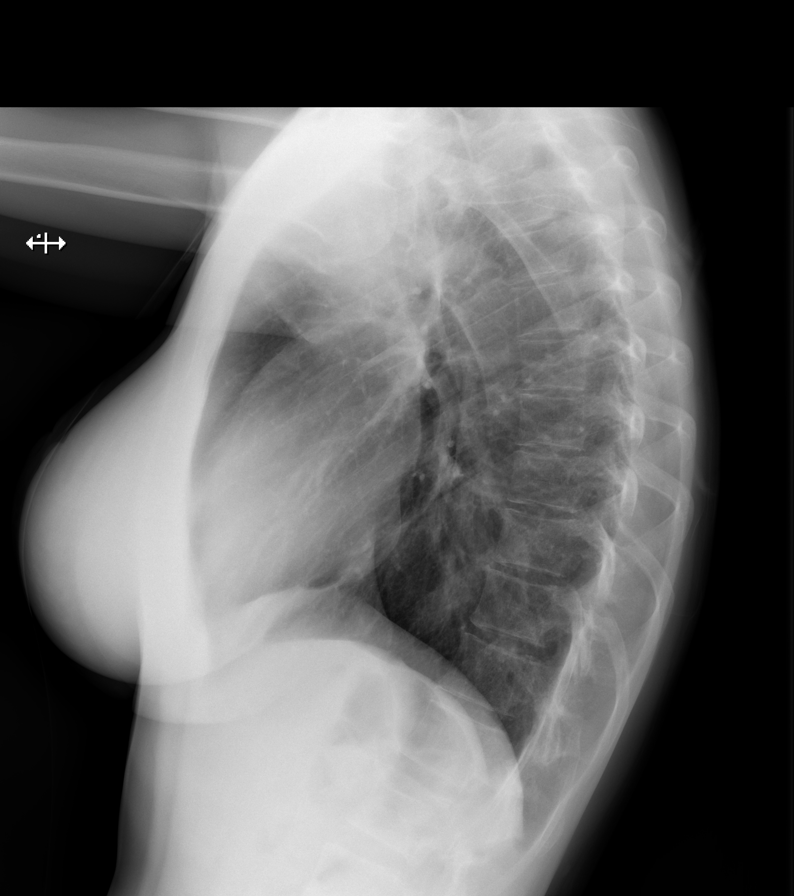

[w chest lat (2 of 2)]
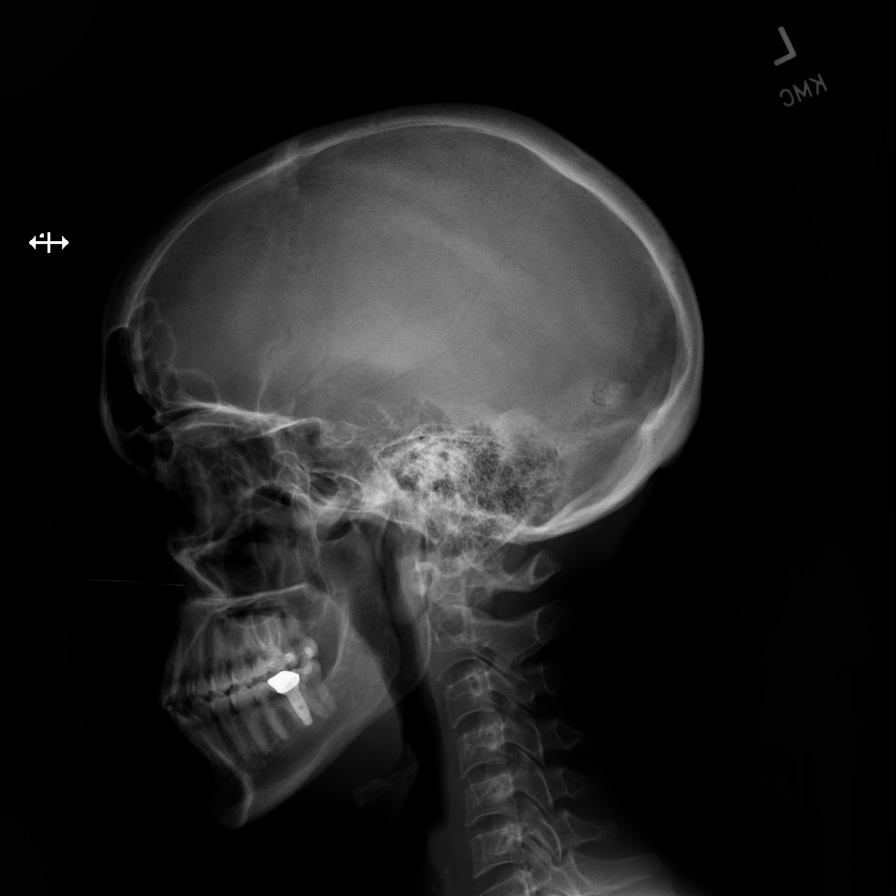

[w abdomen upright (1 of 2)]
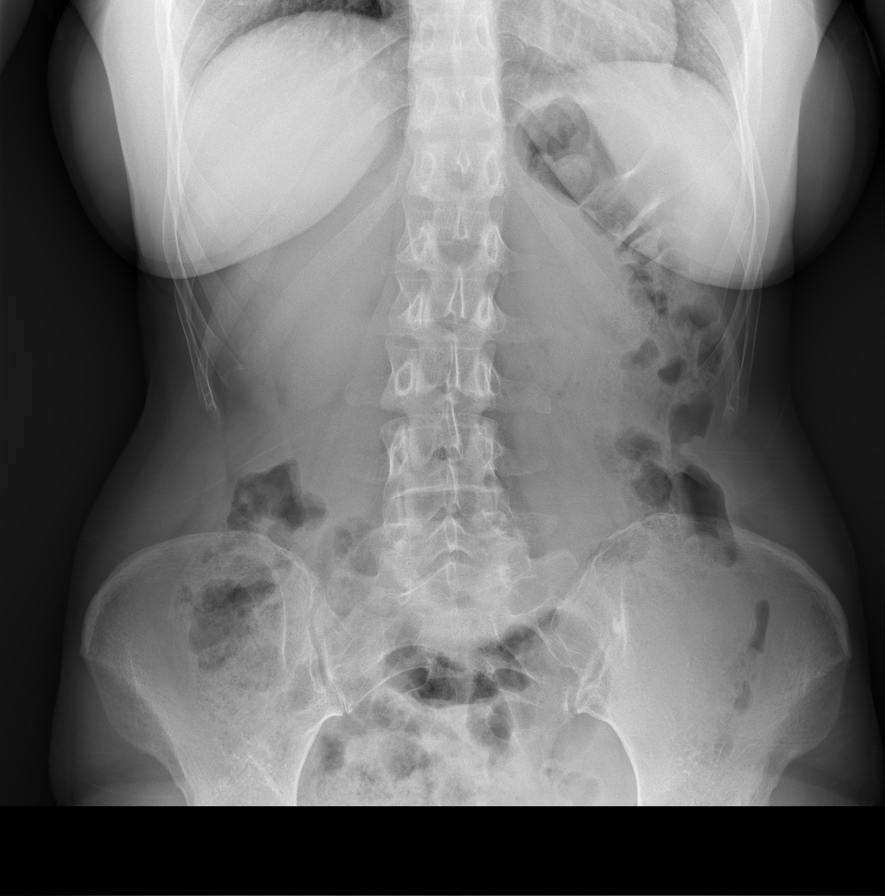

[w abdomen upright (2 of 2)]
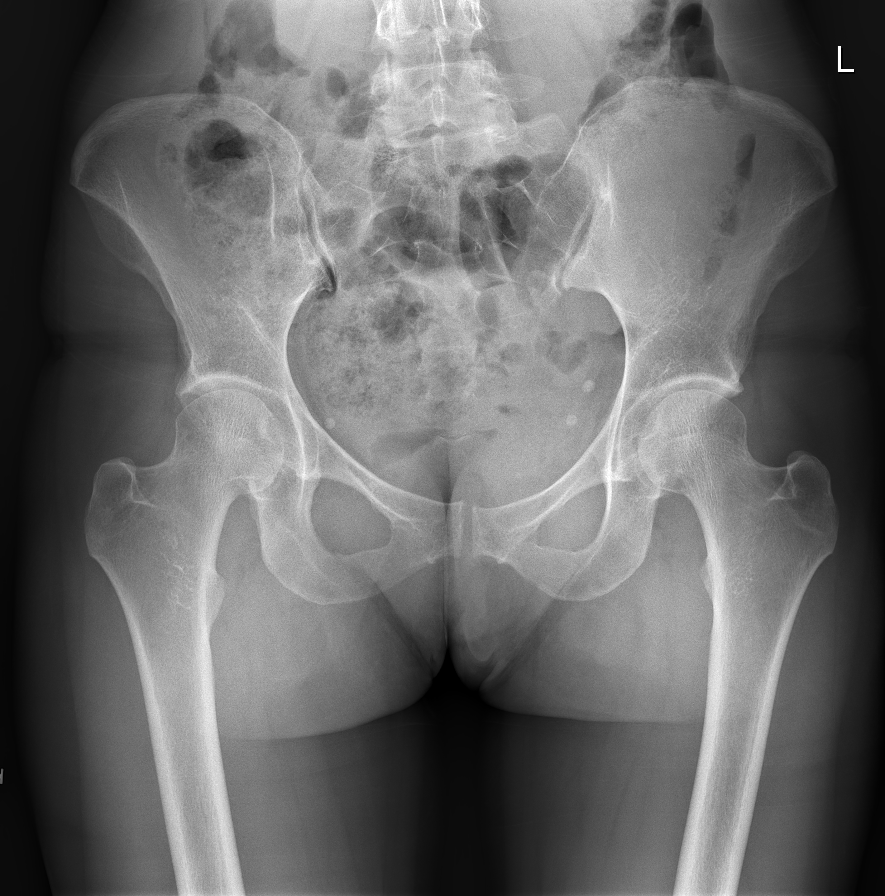

[w scapula ap right]
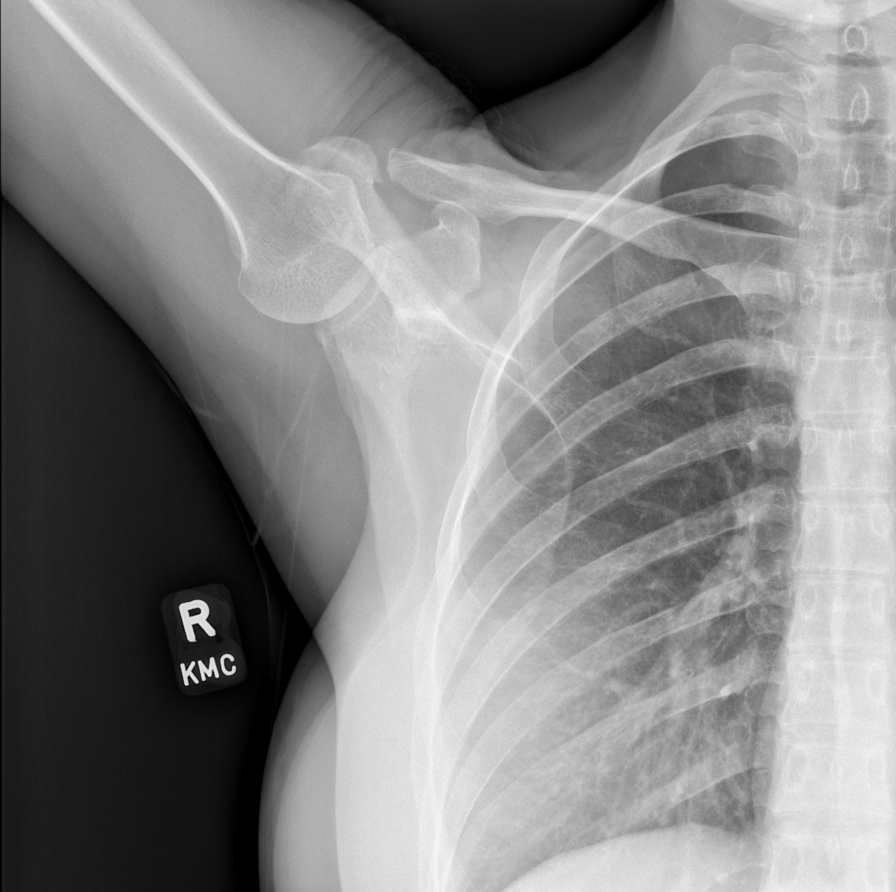

[w scapula ap left]
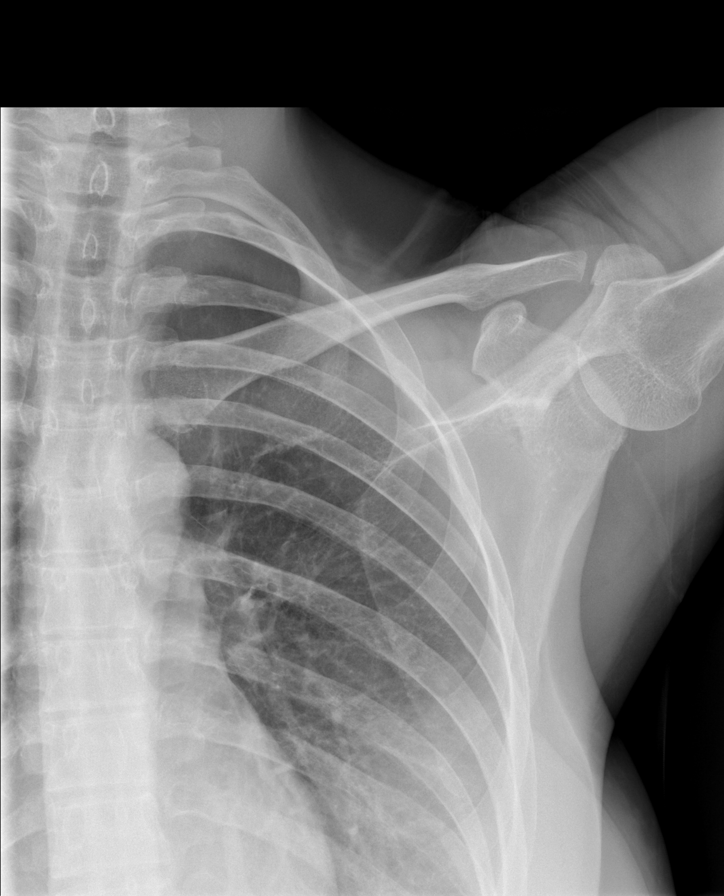

[w clavicle ap left]
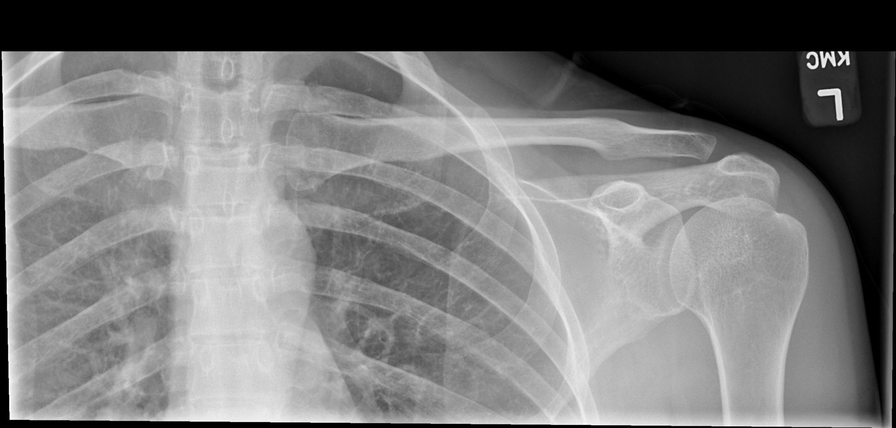

[8 of 10 positions shown; findings below may reference images not displayed]

FINDINGS: Heart is normal size.  Lungs are clear.  No effusions.

No acute bony abnormality.  No suspicious focal bone lesion.
IMPRESSION: Unremarkable study.

## 2023-01-15 DIAGNOSIS — J018 Other acute sinusitis: Secondary | ICD-10-CM | POA: Diagnosis not present

## 2023-01-15 DIAGNOSIS — Z6821 Body mass index (BMI) 21.0-21.9, adult: Secondary | ICD-10-CM | POA: Diagnosis not present

## 2023-01-18 ENCOUNTER — Ambulatory Visit
Admission: EM | Admit: 2023-01-18 | Discharge: 2023-01-18 | Disposition: A | Discharge status: Home / Self Care | Payer: 59 | Attending: Family Medicine | Admitting: Family Medicine

## 2023-01-18 DIAGNOSIS — J069 Acute upper respiratory infection, unspecified: Secondary | ICD-10-CM

## 2023-01-18 MED ORDER — BENZONATATE 200 MG PO CAPS: 200.0000 mg | ORAL_CAPSULE | Freq: Three times a day (TID) | ORAL | 0 refills | Status: AC | PRN

## 2023-01-18 NOTE — Discharge Instructions (Addendum)
Start Tessalon as needed for cough.  Lots of rest and fluids including over-the-counter vitamin C, teas and honey.  Please follow-up with your PCP if your symptoms are not improving.  Please go to the ER for any worsening symptoms.  I hope you feel better soon!

## 2023-01-18 NOTE — ED Provider Notes (Signed)
[facility]    CSN: [id number] Arrival date & time: 01/18/23  1102      History   Chief Complaint Chief Complaint  Patient presents with   Cough    HPI Anna [name] is a 33 y.o. female  presents for evaluation of URI symptoms for 5 days. Patient reports associated symptoms of cough, congestion, fatigue. Denies N/V/D, sore throat, fevers, ear pain, body aches, shortness of breath. Patient does not have a hx of asthma. Patient is not an active smoker.   Reports no known sick contacts but she does work in Teacher, music.  [facility]day 2 of symptoms) and was prescribed Augmentin for sinusitis.  She has been taking as prescribed and reports no improvement in symptoms.  Pt has taken Mucinex OTC for symptoms. Pt has no other concerns at this time.    Cough   Past Medical History:  Diagnosis Date   Abnormal pap 2012/2013   hx ascus with HR HPV/colpo w/bx 03/2010--CIN I   Anorexia 03/2011   Treated at 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   Anorexia nervosa 03/26/2012   Depression, recurrent (HCC) 07/08/2008   PYLOROSPASM 07/08/2008   IRRITABLE BOWEL SYNDROME 07/08/2008    Past Surgical History:  Procedure Laterality Date   AUGMENTATION MAMMAPLASTY     BREAST SURGERY Bilateral 11/06/2020   COLPOSCOPY     COLPOSCOPY W/ BIOPSY / CURETTAGE  03/16/2010   mild dysplasia HPV effect,  CIN I   WISDOM TOOTH EXTRACTION  2006    OB History     Gravida  2   Para  2   Term  2   Preterm      AB      Living  2      SAB      IAB      Ectopic      Multiple  0   Live Births  2            Home Medications    Prior to Admission medications   Medication Sig Start Date End Date Taking? Authorizing Provider  benzonatate (TESSALON) 200 MG capsule Take 1 capsule (200 mg total) by mouth 3 (three) times daily as needed. 01/18/23  Yes Radford Pax, NP  amoxicillin-clavulanate (AUGMENTIN) 875-125 MG tablet Take 1 tablet by mouth 2 (two) times daily. 02/01/22   Waldon Merl, PA-C  BIOTIN PO Take by mouth.    [provider]  drospirenone-ethinyl estradiol (YAZ) 3-0.02 MG tablet  06/19/17   [provider]  FLUoxetine (PROZAC) 40 MG capsule TAKE 1 CAPSULE (40 MG TOTAL) BY MOUTH DAILY. 10/16/22   Sheliah Hatch, MD  fluticasone (FLONASE) 50 MCG/ACT nasal spray Place 2 sprays into both nostrils daily. 02/01/22   Waldon Merl, PA-C  valACYclovir (VALTREX) 500 MG tablet Valtrex  500 mg tablet  Take 1 tablet every day by oral route.    [provider]    Family History Family History  Problem Relation Age of Onset   Heart disease Father    Prostate cancer Maternal Grandfather    Depression Mother    Anxiety disorder Mother    Healthy Sister    Lung cancer Paternal Grandfather     Social History Social History   Tobacco Use   Smoking status: Never   Smokeless tobacco: Never  Vaping Use   Vaping status: Never Used  Substance Use Topics   Alcohol use: Yes    Comment: socially   Drug use: No     Allergies   Patient has no known allergies.   Review of Systems Review of Systems  Constitutional:  Positive for fatigue.  HENT:  Positive for congestion.   Respiratory:  Positive for cough.      Physical Exam Triage Vital Signs ED Triage Vitals  Encounter Vitals Group     BP 01/18/23 1141 112/78     Systolic  BP Percentile --      Diastolic BP Percentile --      Pulse Rate 01/18/23 1141 79     Resp 01/18/23 1141 16     Temp 01/18/23 1141 98.9 F (37.2 C)     Temp Source 01/18/23 1141 Oral     SpO2 01/18/23 1141 98 %     Weight --      Height --      Head Circumference --      Peak Flow --      Pain Score 01/18/23 1142 0     Pain Loc --      Pain Education --      Exclude from Growth Chart --    No data found.  Updated Vital Signs BP 112/78 (BP Location: Left Arm)   Pulse 79   Temp 98.9 F (37.2 C) (Oral)   Resp 16   LMP 01/08/2023 (Approximate)   SpO2 98%   Visual Acuity Right Eye Distance:   Left Eye Distance:   Bilateral Distance:    Right Eye Near:   Left Eye Near:    Bilateral Near:     Physical Exam Vitals and nursing note reviewed.  Constitutional:      General: She is not in acute distress.    Appearance: She is well-developed. She is not ill-appearing.  HENT:     Head: Normocephalic and atraumatic.     Right Ear: Tympanic membrane and ear canal normal.     Left Ear: Tympanic membrane and ear canal normal.     Nose: Rhinorrhea present. Rhinorrhea is clear.     Right Turbinates: Not swollen or pale.     Left Turbinates: Not swollen or pale.     Right Sinus: No maxillary sinus tenderness or frontal sinus tenderness.     Left Sinus: No maxillary sinus tenderness or frontal sinus tenderness.     Mouth/Throat:     Mouth: Mucous membranes are moist.     Pharynx: Oropharynx is clear. Uvula midline. No oropharyngeal exudate or posterior oropharyngeal erythema.     Tonsils: No tonsillar exudate or tonsillar abscesses.  Eyes:     Conjunctiva/sclera: Conjunctivae normal.     Pupils: Pupils are equal, round, and reactive to light.  Cardiovascular:     Rate and Rhythm: Normal rate and regular rhythm.     Heart sounds: Normal heart sounds.  Pulmonary:  Effort: Pulmonary effort is normal.     Breath sounds: Normal breath sounds. No wheezing or rhonchi.   Musculoskeletal:     Cervical back: Normal range of motion and neck supple.  Lymphadenopathy:     Cervical: No cervical adenopathy.  Skin:    General: Skin is warm and dry.  Neurological:     General: No focal deficit present.     Mental Status: She is alert and oriented to person, place, and time.  Psychiatric:        Mood and Affect: Mood normal.        Behavior: Behavior normal.      UC Treatments / Results  Labs (all labs ordered are listed, but only abnormal results are displayed) Labs Reviewed - No data to display  EKG   Radiology No results found.  Procedures Procedures (including critical care time)  Medications Ordered in UC Medications - No data to display  Initial Impression / Assessment and Plan / UC Course  I have reviewed the triage vital signs and the nursing notes.  Pertinent labs & imaging results that were available during my care of the patient were reviewed by me and considered in my medical decision making (see chart for details).     Reviewed exam and symptoms with patient.  No red flags.  Discussed symptoms and exam consistent with a viral illness which is why she has had no improvement on the Augmentin.  Advised that she can stop this and we will treat symptomatically.  Tessalon as needed for cough.  PCP follow-up if symptoms do not improve.  ER precautions reviewed. Final Clinical Impressions(s) / UC Diagnoses   Final diagnoses:  Viral upper respiratory illness     Discharge Instructions      Start Tessalon as needed for cough.  Lots of rest and fluids including over-the-counter vitamin C, teas and honey.  Please follow-up with your PCP if your symptoms are not improving.  Please go to the ER for any worsening symptoms.  I hope you feel better soon!    ED Prescriptions     Medication Sig Dispense Auth. Provider   benzonatate (TESSALON) 200 MG capsule Take 1 capsule (200 mg total) by mouth 3 (three) times daily as needed. 20 capsule  Radford Pax, NP      PDMP not reviewed this encounter.   Radford Pax, NP 01/18/23 [phone]

## 2023-01-18 NOTE — ED Triage Notes (Addendum)
Pt states cough and fatigue for the past 5 days.  [facility]  States she has been on Augmentin for the past 4 days and she is not feeling any better.

## 2023-03-16 ENCOUNTER — Telehealth: Payer: Self-pay

## 2023-03-16 MED ORDER — OSELTAMIVIR PHOSPHATE 75 MG PO CAPS: 75.0000 mg | ORAL_CAPSULE | Freq: Two times a day (BID) | ORAL | 0 refills | Status: AC

## 2023-03-16 NOTE — Telephone Encounter (Signed)
 Called patient to relay Dr.Vincent note, Left VM to return call.

## 2023-03-16 NOTE — Telephone Encounter (Signed)
 Patients son tested positive for Flu A, she is requesting medication for herself for if she were to get it. Is this an acceptable order or does patient need and appointment?    There are no appointments available here or 3 other locations.

## 2023-03-16 NOTE — Telephone Encounter (Signed)
 Copied from CRM [id number]. Topic: Clinical - Medication Question >> Mar 15, 2023  5:01 PM Sonny Dandy B wrote: Reason for CRM: pt called to request Flu medication states her son was diagnosed with FLU A. She would like some medication therefore she does get it. Please call pt back at [phone]

## 2023-03-16 NOTE — Telephone Encounter (Signed)
 Ok, I have prescribed Tamiflu 75mg  #10 tabs. She can use this medicine to prevent contracting flu by using one tablet once a day for 10 days. Alternatively she could use it only if she gets Flu symptoms, then would use one tablet twice day for 5 days. Common side effects include nausea and vomiting.

## 2023-03-30 ENCOUNTER — Ambulatory Visit: Payer: Self-pay

## 2023-03-30 ENCOUNTER — Ambulatory Visit: Payer: Self-pay | Admitting: Student in an Organized Health Care Education/Training Program

## 2023-03-30 ENCOUNTER — Telehealth: Payer: Self-pay | Admitting: Family Medicine

## 2023-03-30 NOTE — Telephone Encounter (Signed)
 Just a FYI. Advised pt to be seen at urgent care or ER

## 2023-03-30 NOTE — Telephone Encounter (Signed)
 2nd attempt - call can not be completed as dialed

## 2023-03-30 NOTE — Telephone Encounter (Signed)
 Left vm to call office

## 2023-03-30 NOTE — Telephone Encounter (Signed)
 FYI about patients care.

## 2023-03-30 NOTE — Telephone Encounter (Signed)
 This is your 4:00pm video visit

## 2023-03-30 NOTE — Telephone Encounter (Signed)
 1st attempt - Received "call cannot be completed as dialed". Will continue to attempt.      Copied From CRM [id number]. Reason for Triage: Patient states she's had diarrhea since Monday, can't eat lost weight.   Alpa [phone]

## 2023-03-30 NOTE — Telephone Encounter (Signed)
 Chief Complaint: diarrhea Symptoms: diarrhea, dark urine, weakness, weight loss, heavy menstrual period Frequency: since Monday Pertinent Negatives: Patient denies CP, SOB, fever Disposition: [x] ED /[] Urgent Care (no appt availability in office) / [] Appointment(In office/virtual)/ []  [facility]/ [] Home Care/ [x] Refused Recommended Disposition /[]  Mobile Bus/ []  Follow-up with PCP Additional Notes: Pt reports severe diarrhea since Monday. Pt reports >10 episodes of diarrhea per day. Pt states she had 6 episodes of diarrhea last night and was up all night using the restroom. Pt states it is "straight water." 6/10 intermittent abdominal pain. Pt states she is eating very little, oral intake triggers diarrhea. Pt states she is not drinking as many liquids as she should either. Endorses dark urine and weakness but denies dizziness and lightheadedness.   Pt states she took an Ozempic shot Monday of last week (it appears she is not prescribed this). Pt states she was vomiting two days thereafter. She started to feel better. Then, Monday the diarrhea began.   Pt also endorses that she got her menstrual cycle Wednesday last week and states it's the worst it's ever been. Pt states she is still bleeding (9 days now). She states this is unusual for her. Additionally the bleeding has been heavy. She states typically a tampon lasts 4 hours for her but she's had to change her tampon within 30 minutes.  RN advised ED d/t concerns of dehydration, abdominal pain, severe diarrhea, and heavy period. Pt declined, stating she is worried about the cost. RN advised the pt everyone is treated in the ED regardless of ability to pay and she can talk to registration there about financial assistance. Pt verbalized understanding but did not state she would go. RN advised pt RN would relay her symptoms to the office. RN urged pt she needs to go to the ED most definitely for worsening weakness, if she begins  vomiting, and for CP or SOB. Pt verbalized understanding.    Copied from CRM [id number]. Topic: Clinical - Pink Word Triage >> Mar 30, 2023  8:19 AM Gurney Maxin H wrote: Reason for Triage: Patient states she's had diarrhea since Monday, can't eat lost weight.   Yaira [phone] Reason for Disposition  [1] Drinking very little AND [2] dehydration suspected (e.g., no urine > 12 hours, very dry mouth, very lightheaded)  Answer Assessment - Initial Assessment Questions 1. DIARRHEA SEVERITY: "How bad is the diarrhea?" "How many more stools have you had in the past 24 hours than normal?"    - NO DIARRHEA (SCALE 0)   - MILD (SCALE 1-3): Few loose or mushy BMs; increase of 1-3 stools over normal daily number of stools; mild increase in ostomy output.   -  MODERATE (SCALE 4-7): Increase of 4-6 stools daily over normal; moderate increase in ostomy output.   -  SEVERE (SCALE 8-10; OR "WORST POSSIBLE"): Increase of 7 or more stools daily over normal; moderate increase in ostomy output; incontinence.     > 10 2. ONSET: "When did the diarrhea begin?"      Since Monday 3. BM CONSISTENCY: "How loose or watery is the diarrhea?"      "Straight water" 4. VOMITING: "Are you also vomiting?" If Yes, ask: "How many times in the past 24 hours?"      Vomited last week after an Ozempic shot 5. ABDOMEN PAIN: "Are you having any abdomen pain?" If Yes, ask: "What does it feel like?" (e.g., crampy, dull, intermittent, constant)      Yes 6. ABDOMEN PAIN SEVERITY:  If present, ask: "How bad is the pain?"  (e.g., Scale 1-10; mild, moderate, or severe)   - MILD (1-3): doesn't interfere with normal activities, abdomen soft and not tender to touch    - MODERATE (4-7): interferes with normal activities or awakens from sleep, abdomen tender to touch    - SEVERE (8-10): excruciating pain, doubled over, unable to do any normal activities       "Right now I am not in pain, but I haven't eaten", "most episodes happen at night so I  was up all night, probably went 6 times last night" - abdominal pain is intermittent, worse after eating, she rates it a 6-7/10 7. ORAL INTAKE: If vomiting, "Have you been able to drink liquids?" "How much liquids have you had in the past 24 hours?"     "I have been drinking liquids during the day, not as much as I used to because I just can't, but then I feel like within an hour it comes out (diarrhea)" 8. HYDRATION: "Any signs of dehydration?" (e.g., dry mouth [not just dry lips], too weak to stand, dizziness, new weight loss) "When did you last urinate?"     Endorses weakness, states she has not been eating. Endorses eating a few bites of grilled chicken last night, but then she was up all night with diarrhea. Endorses weight loss. Denies dizziness or lightheadedness. Endorses dark yellow urine.  9. 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 ANTIBIOTIC USE: "Are you taking antibiotics now or have you taken antibiotics in the past 2 months?"       Augmentin prescribed 1/31 11. OTHER SYMPTOMS: "Do you have any other symptoms?" (e.g., fever, blood in stool)       "Monday of last week I took an Ozempic shot and I was sick for two days throwing up on Tuesday and Wednesday, then I felt okay and I was able to function, and then Monday night I started having diarrhea", taking Immodium not helping. Last week when she was throwing up she started the worst period she ever had (Wednesday of last week), bleeding now for 9 days which is unusual for her. Also states she just had a period 2 wks ago. States this is "the worst period she's ever had." Heavy bleeding. States she needed to change her tampon q 30 mins. 12. PREGNANCY: "Is there any chance you are pregnant?" "When was your last menstrual  period?"       Last period happening now.  Protocols used: [facility]

## 2023-03-30 NOTE — Telephone Encounter (Signed)
 If she is not willing to go to ER, please have her seen by any available provider for further evaluation

## 2023-03-30 NOTE — Telephone Encounter (Signed)
 3rd attempt to reach pt w/o success: number listed states # call can not be completed as dialed.  Will route information to PCP clinic.

## 2023-03-30 NOTE — Telephone Encounter (Signed)
 Pt had an appointment scheduled for today 3.28.25 for a video visit, insurance on file was inactive. Called pt to get new insurance but she had [employer] insurance which is one of those that had to be verified.  I did get all of the information I needed to verify her insurance for her visit but her insurance company only allow fax verifications, no live person that can give me a name of who I spoke with and call reference number for proof or verification. I mentioned to the pt that our fax machine takes time to receive faxes and it was going to over lap in her check in process for her appointment which was at 4Pm.  Pt was aware that we couldn't verify the insurance at the moment and that we could bill her for today's visit and that we do not have a turn down policy.  Pt was also told that her Insurance was not verified but could've been added which means I wouldn't know a copay or what she was billed once she's billed.. -- Pt will wait until her insurance come in via fax, will work on her insurance portal. Pt did not want to be billed.. Pt was recalled for the last time because her nurse wanted to also let her know that; she need to be seen "as soon as possible" if possible at a Urgent Care or ED for her issues.Zoe Lan

## 2023-03-31 ENCOUNTER — Other Ambulatory Visit: Payer: Self-pay

## 2023-03-31 ENCOUNTER — Emergency Department (HOSPITAL_BASED_OUTPATIENT_CLINIC_OR_DEPARTMENT_OTHER)
Admission: EM | Admit: 2023-03-31 | Discharge: 2023-03-31 | Disposition: A | Discharge status: Home / Self Care | Payer: PRIVATE HEALTH INSURANCE | Attending: Emergency Medicine | Admitting: Emergency Medicine

## 2023-03-31 DIAGNOSIS — R197 Diarrhea, unspecified: Secondary | ICD-10-CM | POA: Diagnosis not present

## 2023-03-31 DIAGNOSIS — R109 Unspecified abdominal pain: Secondary | ICD-10-CM | POA: Diagnosis present

## 2023-03-31 DIAGNOSIS — R1110 Vomiting, unspecified: Secondary | ICD-10-CM | POA: Insufficient documentation

## 2023-03-31 LAB — CBC WITH DIFFERENTIAL/PLATELET
Abs Immature Granulocytes: 0.02 10*3/uL (ref 0.00–0.07)
Basophils Absolute: 0 10*3/uL (ref 0.0–0.1)
Basophils Relative: 0 %
Eosinophils Absolute: 0.1 10*3/uL (ref 0.0–0.5)
Eosinophils Relative: 3 %
HCT: 36.4 % (ref 36.0–46.0)
Hemoglobin: 13 g/dL (ref 12.0–15.0)
Immature Granulocytes: 0 %
Lymphocytes Relative: 20 %
Lymphs Abs: 1.1 10*3/uL (ref 0.7–4.0)
MCH: 31.6 pg (ref 26.0–34.0)
MCHC: 35.7 g/dL (ref 30.0–36.0)
MCV: 88.6 fL (ref 80.0–100.0)
Monocytes Absolute: 0.4 10*3/uL (ref 0.1–1.0)
Monocytes Relative: 8 %
Neutro Abs: 3.6 10*3/uL (ref 1.7–7.7)
Neutrophils Relative %: 69 %
Platelets: 380 10*3/uL (ref 150–400)
RBC: 4.11 MIL/uL (ref 3.87–5.11)
RDW: 12.3 % (ref 11.5–15.5)
WBC: 5.3 10*3/uL (ref 4.0–10.5)
nRBC: 0 % (ref 0.0–0.2)

## 2023-03-31 LAB — BASIC METABOLIC PANEL WITH GFR
Anion gap: 11 (ref 5–15)
BUN: 14 mg/dL (ref 6–20)
CO2: 21 mmol/L — ABNORMAL LOW (ref 22–32)
Calcium: 9.3 mg/dL (ref 8.9–10.3)
Chloride: 104 mmol/L (ref 98–111)
Creatinine, Ser: 0.91 mg/dL (ref 0.44–1.00)
GFR, Estimated: >60 mL/min (ref >60)
Glucose, Bld: 86 mg/dL (ref 70–99)
Potassium: 4.5 mmol/L (ref 3.5–5.1)
Sodium: 136 mmol/L (ref 135–145)

## 2023-03-31 LAB — HCG, SERUM, QUALITATIVE: Preg, Serum: NEGATIVE

## 2023-03-31 MED ORDER — SODIUM CHLORIDE 0.9 % IV SOLN
INTRAVENOUS | Status: DC
Start: 2023-03-31 — End: 2023-03-31

## 2023-03-31 MED ORDER — DIPHENOXYLATE-ATROPINE 2.5-0.025 MG PO TABS: 2.0000 | ORAL_TABLET | Freq: Four times a day (QID) | ORAL | 0 refills | Status: AC | PRN

## 2023-03-31 MED ORDER — SODIUM CHLORIDE 0.9 % IV BOLUS
1000.0000 mL | Freq: Once | INTRAVENOUS | Status: AC
  Administered 2023-03-31: 1000 mL via INTRAVENOUS

## 2023-03-31 MED ORDER — LOPERAMIDE HCL 2 MG PO CAPS
2.0000 mg | ORAL_CAPSULE | ORAL | Status: DC | PRN
  Administered 2023-03-31 (×2): 2 mg via ORAL
  Filled 2023-03-31: qty 1

## 2023-03-31 NOTE — ED Triage Notes (Signed)
 Monday started with diarrhea 7 times a day, worse at night. Prior to Monday, had been vomiting for 2 days ,was better for few days and then this returned. Pt not drinking very much due to fear of having more diarrhea. Pt missed birth control 2 days  and is having severe break through bleeding.

## 2023-03-31 NOTE — ED Provider Notes (Signed)
 [facility] AT [facility] Provider Note   CSN: [id number] Arrival date & time: 03/31/23  4098     History  No chief complaint on file.   Anna [name] is a 33 y.o. female.  33 year old female presents with abdominal cramping and watery diarrhea.  Patient took a dose of Ozempic about a week and a half ago.  This was not prescribed for her.  States she took 10 mg.  Did have some emesis initially which is since resolved.  No fever or chills.  Denies any recent antibiotic use.  Has been using Imodium without relief       Home Medications Prior to Admission medications   Medication Sig Start Date End Date Taking? Authorizing Provider  amoxicillin-clavulanate (AUGMENTIN) 875-125 MG tablet Take 1 tablet by mouth 2 (two) times daily. 02/01/22   Waldon Merl, PA-C  benzonatate (TESSALON) 200 MG capsule Take 1 capsule (200 mg total) by mouth 3 (three) times daily as needed. 01/18/23   Radford Pax, NP  BIOTIN PO Take by mouth.    [provider]  drospirenone-ethinyl estradiol (YAZ) 3-0.02 MG tablet  06/19/17   [provider]  FLUoxetine (PROZAC) 40 MG capsule TAKE 1 CAPSULE (40 MG TOTAL) BY MOUTH DAILY. 10/16/22   Sheliah Hatch, MD  fluticasone (FLONASE) 50 MCG/ACT nasal spray Place 2 sprays into both nostrils daily. 02/01/22   Waldon Merl, PA-C  valACYclovir (VALTREX) 500 MG tablet Valtrex 500 mg tablet  Take 1 tablet every day by oral route.    [provider]      Allergies    Patient has no known allergies.    Review of Systems   Review of Systems  All other systems reviewed and are negative.   Physical Exam Updated Vital Signs BP (!) 121/94 (BP Location: Right Arm)   Pulse 85   Temp 98 F (36.7 C) (Temporal)   Resp 18   Ht 1.753 m (5\' 9" )   Wt 62.9 kg   SpO2 100%   BMI 20.48 kg/m  Physical Exam Vitals and nursing note reviewed.  Constitutional:      General: She is not in acute distress.     Appearance: Normal appearance. She is well-developed. She is not toxic-appearing.  HENT:     Head: Normocephalic and atraumatic.  Eyes:     General: Lids are normal.     Conjunctiva/sclera: Conjunctivae normal.     Pupils: Pupils are equal, round, and reactive to light.  Neck:     Thyroid: No thyroid mass.     Trachea: No tracheal deviation.  Cardiovascular:     Rate and Rhythm: Normal rate and regular rhythm.     Heart sounds: Normal heart sounds. No murmur heard.    No gallop.  Pulmonary:     Effort: Pulmonary effort is normal. No respiratory distress.     Breath sounds: Normal breath sounds. No stridor. No decreased breath sounds, wheezing, rhonchi or rales.  Abdominal:     General: There is no distension.     Palpations: Abdomen is soft.     Tenderness: There is no abdominal tenderness. There is no rebound.  Musculoskeletal:        General: No tenderness. Normal range of motion.     Cervical back: Normal range of motion and neck supple.  Skin:    General: Skin is warm and dry.     Findings: No abrasion or rash.  Neurological:  Mental Status: She is alert and oriented to person, place, and time. Mental status is at baseline.     GCS: GCS eye subscore is 4. GCS verbal subscore is 5. GCS motor subscore is 6.     Cranial Nerves: No cranial nerve deficit.     Sensory: No sensory deficit.     Motor: Motor function is intact.  Psychiatric:        Attention and Perception: Attention normal.        Speech: Speech normal.        Behavior: Behavior normal.     ED Results / Procedures / Treatments   Labs (all labs ordered are listed, but only abnormal results are displayed) Labs Reviewed - No data to display  EKG None  Radiology No results found.  Procedures Procedures    Medications Ordered in ED Medications  loperamide (IMODIUM) capsule 2 mg (has no administration in time range)  0.9 %  sodium chloride infusion (has no administration in time range)  sodium  chloride 0.9 % bolus 1,000 mL (has no administration in time range)    ED Course/ Medical Decision Making/ A&P                                 Medical Decision Making Amount and/or Complexity of Data Reviewed Labs: ordered.  Risk Prescription drug management.   Patient given IV fluids and Lomotil and feels better here.  Patient's labs are reassuring here.  Suspect her symptoms are from the Ozempic.  Will prescribe Lomotil and discharge        Final Clinical Impression(s) / ED Diagnoses Final diagnoses:  None    Rx / DC Orders ED Discharge Orders     None         Lorre Nick, MD 03/31/23 1139
